# Patient Record
Sex: Male | Born: 1965
Health system: Southern US, Community
[De-identification: ages and names within clinical notes are randomized; demographics above are authoritative.]

## PROBLEM LIST (undated history)

## (undated) DIAGNOSIS — I639 Cerebral infarction, unspecified: Secondary | ICD-10-CM

## (undated) DIAGNOSIS — F32A Depression, unspecified: Secondary | ICD-10-CM

## (undated) DIAGNOSIS — B029 Zoster without complications: Secondary | ICD-10-CM

## (undated) DIAGNOSIS — F329 Major depressive disorder, single episode, unspecified: Secondary | ICD-10-CM

## (undated) DIAGNOSIS — Z9109 Other allergy status, other than to drugs and biological substances: Secondary | ICD-10-CM

## (undated) DIAGNOSIS — I10 Essential (primary) hypertension: Secondary | ICD-10-CM

## (undated) HISTORY — DX: Zoster without complications: B02.9

## (undated) HISTORY — DX: Depression, unspecified: F32.A

## (undated) HISTORY — DX: Other allergy status, other than to drugs and biological substances: Z91.09

## (undated) HISTORY — DX: Major depressive disorder, single episode, unspecified: F32.9

## (undated) HISTORY — DX: Cerebral infarction, unspecified: I63.9

## (undated) HISTORY — DX: Essential (primary) hypertension: I10

## (undated) HISTORY — PX: ESOPHAGUS SURGERY: SHX626

---

## 2001-01-08 ENCOUNTER — Emergency Department (HOSPITAL_COMMUNITY): Admission: EM | Admit: 2001-01-08 | Discharge: 2001-01-08 | Payer: Self-pay | Admitting: *Deleted

## 2004-12-18 ENCOUNTER — Ambulatory Visit: Payer: Self-pay | Admitting: Cardiology

## 2005-10-03 ENCOUNTER — Ambulatory Visit: Payer: Self-pay | Admitting: Cardiology

## 2005-10-03 ENCOUNTER — Inpatient Hospital Stay (HOSPITAL_COMMUNITY): Admission: EM | Admit: 2005-10-03 | Discharge: 2005-10-05 | Payer: Self-pay | Admitting: Emergency Medicine

## 2005-10-04 ENCOUNTER — Ambulatory Visit: Payer: Self-pay | Admitting: *Deleted

## 2006-01-22 ENCOUNTER — Emergency Department (HOSPITAL_COMMUNITY): Admission: EM | Admit: 2006-01-22 | Discharge: 2006-01-22 | Payer: Self-pay | Admitting: Emergency Medicine

## 2011-01-24 ENCOUNTER — Emergency Department (HOSPITAL_COMMUNITY): Payer: Self-pay

## 2011-01-24 ENCOUNTER — Emergency Department (HOSPITAL_COMMUNITY)
Admission: EM | Admit: 2011-01-24 | Discharge: 2011-01-24 | Disposition: A | Discharge status: Home / Self Care | Payer: Self-pay | Attending: Emergency Medicine | Admitting: Emergency Medicine

## 2011-01-24 ENCOUNTER — Encounter: Payer: Self-pay | Admitting: *Deleted

## 2011-01-24 DIAGNOSIS — S0181XA Laceration without foreign body of other part of head, initial encounter: Secondary | ICD-10-CM

## 2011-01-24 DIAGNOSIS — S0180XA Unspecified open wound of other part of head, initial encounter: Secondary | ICD-10-CM | POA: Insufficient documentation

## 2011-01-24 DIAGNOSIS — F17200 Nicotine dependence, unspecified, uncomplicated: Secondary | ICD-10-CM | POA: Insufficient documentation

## 2011-01-24 DIAGNOSIS — S0010XA Contusion of unspecified eyelid and periocular area, initial encounter: Secondary | ICD-10-CM | POA: Insufficient documentation

## 2011-01-24 DIAGNOSIS — IMO0002 Reserved for concepts with insufficient information to code with codable children: Secondary | ICD-10-CM | POA: Insufficient documentation

## 2011-01-24 DIAGNOSIS — Y9229 Other specified public building as the place of occurrence of the external cause: Secondary | ICD-10-CM | POA: Insufficient documentation

## 2011-01-24 DIAGNOSIS — S0083XA Contusion of other part of head, initial encounter: Secondary | ICD-10-CM

## 2011-01-24 MED ORDER — TETANUS-DIPHTH-ACELL PERTUSSIS 5-2.5-18.5 LF-MCG/0.5 IM SUSP
0.5000 mL | Freq: Once | INTRAMUSCULAR | Status: AC
  Administered 2011-01-24: 0.5 mL via INTRAMUSCULAR
  Filled 2011-01-24: qty 0.5

## 2011-01-24 MED ORDER — LIDOCAINE-EPINEPHRINE (PF) 1 %-1:200000 IJ SOLN
INTRAMUSCULAR | Status: AC
  Administered 2011-01-24: 16:00:00
  Filled 2011-01-24: qty 10

## 2011-01-24 MED ORDER — CEPHALEXIN 500 MG PO CAPS: 500.0000 mg | ORAL_CAPSULE | Freq: Four times a day (QID) | ORAL | Status: AC
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
## 2011-01-29 ENCOUNTER — Emergency Department (HOSPITAL_COMMUNITY)
Admission: EM | Admit: 2011-01-29 | Discharge: 2011-01-29 | Disposition: A | Discharge status: Home / Self Care | Payer: Self-pay | Attending: Emergency Medicine | Admitting: Emergency Medicine

## 2011-01-29 ENCOUNTER — Emergency Department (HOSPITAL_COMMUNITY): Payer: Self-pay

## 2011-01-29 ENCOUNTER — Encounter (HOSPITAL_COMMUNITY): Payer: Self-pay | Admitting: *Deleted

## 2011-01-29 ENCOUNTER — Other Ambulatory Visit: Payer: Self-pay

## 2011-01-29 DIAGNOSIS — R000 Tachycardia, unspecified: Secondary | ICD-10-CM | POA: Insufficient documentation

## 2011-01-29 DIAGNOSIS — F0781 Postconcussional syndrome: Secondary | ICD-10-CM | POA: Insufficient documentation

## 2011-01-29 DIAGNOSIS — R42 Dizziness and giddiness: Secondary | ICD-10-CM | POA: Insufficient documentation

## 2011-01-29 DIAGNOSIS — F17200 Nicotine dependence, unspecified, uncomplicated: Secondary | ICD-10-CM | POA: Insufficient documentation

## 2011-01-29 LAB — COMPREHENSIVE METABOLIC PANEL
ALT: 14 U/L (ref 0–53)
AST: 17 U/L (ref 0–37)
BUN: 10 mg/dL (ref 6–23)
Calcium: 9.8 mg/dL (ref 8.4–10.5)
Creatinine, Ser: 0.85 mg/dL (ref 0.50–1.35)
GFR calc Af Amer: >60 mL/min (ref >60)
Glucose, Bld: 80 mg/dL (ref 70–99)
Potassium: 3.5 mEq/L (ref 3.5–5.1)
Sodium: 139 mEq/L (ref 135–145)
Total Bilirubin: 0.4 mg/dL (ref 0.3–1.2)

## 2011-01-29 LAB — CBC
Hemoglobin: 16.4 g/dL (ref 13.0–17.0)
MCHC: 34.2 g/dL (ref 30.0–36.0)
MCV: 91.6 fL (ref 78.0–100.0)
Platelets: 311 10*3/uL (ref 150–400)
RDW: 14.5 % (ref 11.5–15.5)

## 2011-01-29 LAB — DIFFERENTIAL
Lymphocytes Relative: 19 % (ref 12–46)
Lymphs Abs: 2.2 10*3/uL (ref 0.7–4.0)
Neutro Abs: 8.8 10*3/uL — ABNORMAL HIGH (ref 1.7–7.7)
Neutrophils Relative %: 73 % (ref 43–77)

## 2011-01-29 MED ORDER — SODIUM CHLORIDE 0.9 % IV BOLUS (SEPSIS): 1000.0000 mL | Freq: Once | INTRAVENOUS | Status: DC

## 2011-01-29 MED ORDER — MECLIZINE HCL 25 MG PO TABS: 25.0000 mg | ORAL_TABLET | Freq: Four times a day (QID) | ORAL | Status: AC

## 2011-01-29 MED ORDER — MECLIZINE HCL 12.5 MG PO TABS
25.0000 mg | ORAL_TABLET | Freq: Once | ORAL | Status: AC
  Administered 2011-01-29: 25 mg via ORAL
  Filled 2011-01-29: qty 2

## 2011-01-29 NOTE — ED Notes (Signed)
C/o dizziness with nausea 

## 2011-01-29 NOTE — ED Provider Notes (Signed)
History   Scribed for Jeff Lennert, MD, the patient was seen in room APA05/APA05. This chart was scribed by Clarita Crane. This patient's care was started at 6:12PM.  CSN: [id number] Arrival date & time: 01/29/2011  5:52 PM   Chief Complaint  Patient presents with  . Dizziness   HPI Jeff Walker is a 45 y.o. male who presents to the Emergency Department complaining of cosntant dizziness described as his surroundings spinning onset this morning after awaking and persistent since with associated numbness of distal upper extremities, HA, intermittent SOB and fatigue for the past 3 days. Patient reports dizziness is aggravated with movement of his head and mildly relieved with lying still. Denies history of similar symptoms. Patient reports sustaining head injury 6 days ago with laceration above left eye and was evaluated at ED with CT-Head performed. Patient is a current smoker.  HPI ELEMENTS:  Onset: this morning after awaking Duration: persistent   Timing: constant  Quality: surroundings spinning   Modifying factors:  Relieved with lying still and aggravated with movement of head. Context:  as above  Associated symptoms: +nausea, numbness of distal upper extremities, HA, fatigue for 3 days, SOB intermittently. Denies chest pain, abdominal pain, blurred vision.    PAST MEDICAL HISTORY:  History reviewed. No pertinent past medical history.  PAST SURGICAL HISTORY:  Past Surgical History  Procedure Date  . Esophagus surgery     FAMILY HISTORY:  Family History  Problem Relation Age of Onset  . Hypertension Father   . Cancer Father      SOCIAL HISTORY: History   Social History  . Marital Status: Single    Spouse Name: N/A    Number of Children: N/A  . Years of Education: N/A   Social History Main Topics  . Smoking status: Current Everyday Smoker -- 2.0 packs/day    Types: Cigarettes  . Smokeless tobacco: None  . Alcohol Use: Yes     6 pack per week beer  . Drug Use:    . Sexually Active:    Other Topics Concern  . None   Social History Narrative  . None      Review of Systems  Constitutional: Positive for fatigue.  HENT: Negative for congestion, sinus pressure and ear discharge.   Eyes: Negative for discharge.  Respiratory: Positive for shortness of breath. Negative for cough.   Cardiovascular: Negative for chest pain.  Gastrointestinal: Negative for abdominal pain and diarrhea.  Genitourinary: Negative for frequency and hematuria.  Musculoskeletal: Negative for back pain.  Skin: Negative for rash.  Neurological: Positive for dizziness, numbness and headaches. Negative for seizures.  Hematological: Negative.   Psychiatric/Behavioral: Negative for hallucinations.    Allergies  Review of patient's allergies indicates no known allergies.  Home Medications   Current Outpatient Rx  Name Route Sig Dispense Refill  . IBUPROFEN 200 MG PO TABS Oral Take 400 mg by mouth once as needed. For pain     . CEPHALEXIN 500 MG PO CAPS Oral Take 1 capsule (500 mg total) by mouth 4 (four) times daily. 28 capsule 0    Physical Exam    BP 132/71  Pulse 71  Temp(Src) 98.4 F (36.9 C) (Oral)  Resp 20  Ht 6' (1.829 m)  Wt 155 lb (70.308 kg)  BMI 21.02 kg/m2  SpO2 99%  Physical Exam  Nursing note and vitals reviewed. Constitutional: He is oriented to person, place, and time. He appears well-developed and well-nourished. No distress.  HENT:  Head:  Normocephalic and atraumatic.  Right Ear: Tympanic membrane normal.  Left Ear: Tympanic membrane normal.  Mouth/Throat: Oropharynx is clear and moist. Mucous membranes are dry.       Healing laceration to left side of forehead with multiple sutures currently in place.   Eyes: Conjunctivae are normal. Pupils are equal, round, and reactive to light.       Periorbital bruising of left eye.   Neck: Neck supple.  Cardiovascular: Regular rhythm and normal heart sounds.  Tachycardia present.  Exam reveals no  gallop and no friction rub.   No murmur heard. Pulmonary/Chest: Effort normal and breath sounds normal. He has no wheezes.  Abdominal: Soft. Bowel sounds are normal. He exhibits no distension. There is no tenderness.  Musculoskeletal: Normal range of motion. He exhibits no edema.  Neurological: He is alert and oriented to person, place, and time. He has normal reflexes. No sensory deficit. Coordination normal.       Grip strength normal and equal bilaterally.   Skin: Skin is warm and dry.  Psychiatric: He has a normal mood and affect. His behavior is normal.    ED Course  Procedures  OTHER DATA REVIEWED: Nursing notes, vital signs, and past medical records reviewed. Lab results reviewed and considered Imaging results reviewed and considered  DIAGNOSTIC STUDIES: Oxygen Saturation is 100% on room air, normal by my interpretation.    LABS / RADIOLOGY: Results for orders placed during the hospital encounter of 01/29/11  CBC      Component Value Range   WBC 12.0 (*) 4.0 - 10.5 (K/uL)   RBC 5.23  4.22 - 5.81 (MIL/uL)   Hemoglobin 16.4  13.0 - 17.0 (g/dL)   HCT 04.5  40.9 - 81.1 (%)   MCV 91.6  78.0 - 100.0 (fL)   MCH 31.4  26.0 - 34.0 (pg)   MCHC 34.2  30.0 - 36.0 (g/dL)   RDW 91.4  78.2 - 95.6 (%)   Platelets 311  150 - 400 (K/uL)  DIFFERENTIAL      Component Value Range   Neutrophils Relative 73  43 - 77 (%)   Neutro Abs 8.8 (*) 1.7 - 7.7 (K/uL)   Lymphocytes Relative 19  12 - 46 (%)   Lymphs Abs 2.2  0.7 - 4.0 (K/uL)   Monocytes Relative 6  3 - 12 (%)   Monocytes Absolute 0.7  0.1 - 1.0 (K/uL)   Eosinophils Relative 2  0 - 5 (%)   Eosinophils Absolute 0.2  0.0 - 0.7 (K/uL)   Basophils Relative 1  0 - 1 (%)   Basophils Absolute 0.1  0.0 - 0.1 (K/uL)  COMPREHENSIVE METABOLIC PANEL      Component Value Range   Sodium 139  135 - 145 (mEq/L)   Potassium 3.5  3.5 - 5.1 (mEq/L)   Chloride 100  96 - 112 (mEq/L)   CO2 25  19 - 32 (mEq/L)   Glucose, Bld 80  70 - 99 (mg/dL)    BUN 10  6 - 23 (mg/dL)   Creatinine, Ser 2.13  0.50 - 1.35 (mg/dL)   Calcium 9.8  8.4 - 08.6 (mg/dL)   Total Protein 8.2  6.0 - 8.3 (g/dL)   Albumin 3.8  3.5 - 5.2 (g/dL)   AST 17  0 - 37 (U/L)   ALT 14  0 - 53 (U/L)   Alkaline Phosphatase 82  39 - 117 (U/L)   Total Bilirubin 0.4  0.3 - 1.2 (mg/dL)   GFR calc  non Af Amer >60  >60 (mL/min)   GFR calc Af Amer >60  >60 (mL/min)  POCT I-STAT TROPONIN I      Component Value Range   Troponin i, poc 0.00  0.00 - 0.08 (ng/mL)   Comment 3            Dg Chest 2 View  01/29/2011  *RADIOLOGY REPORT*  Clinical Data: Near-syncope  CHEST - 2 VIEW  Comparison: 10/03/2005  Findings: Hyperinflation consistent with COPD.  Postsurgical change right superior hemithorax.  Mild plate-like atelectasis left base. No infiltrates or failure. Normal heart size.  No effusion or pneumothorax.  Bones unremarkable except for postsurgical change. Stable appearance from priors except that the left base atelectasis is new.  IMPRESSION: Hyperinflation.  Postsurgical changes right superior hemithorax. New plate-like atelectasis left base.  Original Report Authenticated By: Elsie Stain, M.D.   Ct Head Wo Contrast  01/29/2011  *RADIOLOGY REPORT*  Clinical Data: 45 year old male with head injury and continued headache.  CT HEAD WITHOUT CONTRAST  Technique:  Contiguous axial images were obtained from the base of the skull through the vertex without contrast.  Comparison: 01/24/2011  Findings: No acute intracranial abnormalities are identified, including mass lesion or mass effect, hydrocephalus, extra-axial fluid collection, midline shift, hemorrhage, or acute infarction.  The visualized bony calvarium is unremarkable. Forehead soft tissue swelling is identified.  IMPRESSION: No evidence of acute intracranial abnormality.  Forehead soft tissue swelling.  Original Report Authenticated By: Rosendo Gros, M.D.    PROCEDURES:  ED COURSE / COORDINATION OF CARE: Orders Placed This  Encounter  Procedures  . DG Chest 2 View  . CT Head Wo Contrast  . CT Head Wo Contrast  . CBC  . Differential  . Comprehensive metabolic panel  . POCT i-Stat troponin I  . ED EKG  10:09PM- Patient informed of current lab and imaging results. Patient explained that CT performed several days ago in relation to head injury was CT-maxillofacial and not Ct- Head and recommend obtaining CT-Head for evaluation of current complaint. Patient consents to have Ct- Head performed.  10:56PM- Patient informed of normal CT- Head results. Patient reports dizziness has improved although still mildly persistent. Informed of intent to d/c home after stitches removed from healing laceration above left brow. Patient agrees with plan set forth.   AVW:[id number],  Post concusion   PLAN: Discharge Home The patient is to return the emergency department if there is any worsening of symptoms. I have reviewed the discharge instructions with the patient/family  CONDITION ON DISCHARGE: Good.  MEDICATIONS GIVEN IN THE E.D.  Medications  sodium chloride 0.9 % bolus 1,000 mL (not administered)  meclizine (ANTIVERT) tablet 25 mg (25 mg Oral Given 01/29/11 1830)      The chart was scribed for me under my direct supervision.  I personally performed the history, physical, and medical decision making and all procedures in the evaluation of this patient.Jeff Lennert, MD 01/29/11 [phone]

## 2011-01-29 NOTE — ED Notes (Signed)
Patient refuse ct. States he had one last week. Dr.zammitt notified

## 2011-01-29 NOTE — ED Notes (Signed)
Dizziness and nausea that started today,

## 2011-01-29 NOTE — ED Notes (Signed)
Patient given sandwich and drink.  

## 2012-04-25 ENCOUNTER — Emergency Department (HOSPITAL_COMMUNITY): Payer: Self-pay

## 2012-04-25 ENCOUNTER — Emergency Department (HOSPITAL_COMMUNITY)
Admission: EM | Admit: 2012-04-25 | Discharge: 2012-04-25 | Disposition: A | Discharge status: Home / Self Care | Payer: Self-pay | Attending: Emergency Medicine | Admitting: Emergency Medicine

## 2012-04-25 ENCOUNTER — Encounter (HOSPITAL_COMMUNITY): Payer: Self-pay | Admitting: *Deleted

## 2012-04-25 DIAGNOSIS — J3489 Other specified disorders of nose and nasal sinuses: Secondary | ICD-10-CM | POA: Insufficient documentation

## 2012-04-25 DIAGNOSIS — R509 Fever, unspecified: Secondary | ICD-10-CM | POA: Insufficient documentation

## 2012-04-25 DIAGNOSIS — J111 Influenza due to unidentified influenza virus with other respiratory manifestations: Secondary | ICD-10-CM

## 2012-04-25 DIAGNOSIS — R630 Anorexia: Secondary | ICD-10-CM | POA: Insufficient documentation

## 2012-04-25 DIAGNOSIS — M2550 Pain in unspecified joint: Secondary | ICD-10-CM | POA: Insufficient documentation

## 2012-04-25 DIAGNOSIS — R1110 Vomiting, unspecified: Secondary | ICD-10-CM | POA: Insufficient documentation

## 2012-04-25 DIAGNOSIS — F17200 Nicotine dependence, unspecified, uncomplicated: Secondary | ICD-10-CM | POA: Insufficient documentation

## 2012-04-25 DIAGNOSIS — R21 Rash and other nonspecific skin eruption: Secondary | ICD-10-CM | POA: Insufficient documentation

## 2012-04-25 DIAGNOSIS — R5381 Other malaise: Secondary | ICD-10-CM | POA: Insufficient documentation

## 2012-04-25 LAB — CBC WITH DIFFERENTIAL/PLATELET
Basophils Absolute: 0.1 10*3/uL (ref 0.0–0.1)
Eosinophils Absolute: 0.2 10*3/uL (ref 0.0–0.7)
Eosinophils Relative: 3 % (ref 0–5)
Hemoglobin: 15.9 g/dL (ref 13.0–17.0)
Lymphocytes Relative: 43 % (ref 12–46)
Lymphs Abs: 2.8 10*3/uL (ref 0.7–4.0)
MCV: 91.8 fL (ref 78.0–100.0)
Monocytes Relative: 8 % (ref 3–12)
Platelets: 212 10*3/uL (ref 150–400)

## 2012-04-25 LAB — BASIC METABOLIC PANEL
BUN: 8 mg/dL (ref 6–23)
Calcium: 9 mg/dL (ref 8.4–10.5)
GFR calc non Af Amer: >90 mL/min (ref >90)
Glucose, Bld: 71 mg/dL (ref 70–99)

## 2012-04-25 MED ORDER — SODIUM CHLORIDE 0.9 % IV BOLUS (SEPSIS)
1000.0000 mL | Freq: Once | INTRAVENOUS | Status: AC
  Administered 2012-04-25: 1000 mL via INTRAVENOUS

## 2012-04-25 MED ORDER — PREDNISONE 50 MG PO TABS: 60.0000 mg | ORAL_TABLET | Freq: Once | ORAL | Status: DC

## 2012-04-25 MED ORDER — ALBUTEROL SULFATE HFA 108 (90 BASE) MCG/ACT IN AERS
2.0000 | INHALATION_SPRAY | RESPIRATORY_TRACT | Status: DC | PRN
  Administered 2012-04-25: 2 via RESPIRATORY_TRACT
  Filled 2012-04-25: qty 6.7

## 2012-04-25 MED ORDER — PREDNISONE 10 MG PO TABS: 20.0000 mg | ORAL_TABLET | Freq: Every day | ORAL | Status: DC

## 2012-04-25 MED ORDER — ALBUTEROL SULFATE (5 MG/ML) 0.5% IN NEBU
5.0000 mg | INHALATION_SOLUTION | Freq: Once | RESPIRATORY_TRACT | Status: AC
  Administered 2012-04-25: 5 mg via RESPIRATORY_TRACT
  Filled 2012-04-25: qty 1

## 2012-04-25 MED ORDER — PREDNISONE 20 MG PO TABS
ORAL_TABLET | ORAL | Status: AC
  Administered 2012-04-25: 60 mg via ORAL
  Filled 2012-04-25: qty 3

## 2012-04-25 MED ORDER — IPRATROPIUM BROMIDE 0.02 % IN SOLN
0.5000 mg | Freq: Once | RESPIRATORY_TRACT | Status: AC
  Administered 2012-04-25: 0.5 mg via RESPIRATORY_TRACT
  Filled 2012-04-25: qty 2.5

## 2012-04-25 NOTE — ED Provider Notes (Signed)
History     CSN: [id number]  Arrival date & time 04/25/12  1904   First MD Initiated Contact with Patient 04/25/12 1923      Chief Complaint  Patient presents with  . Fever    (Consider location/radiation/quality/duration/timing/severity/associated sxs/prior treatment) HPI  Patient complaining of fever x 2 weeks.  Symptoms began with nasal congestion, then cough, nausea, vomiting.  Aching all over.  Patient has not been seen prior to today.  Fever 100.5 last Sunday but has not checked since.  Cough productive of yellow sputum for one week.  Some sob, continues to smoke.  Taking po well, vomited x 5 today.  Some vomiting intermittently but has not been every day. NO diarrhea.  Positive sick contacts.  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  Alcohol intake several times per week.   History reviewed. No pertinent past medical history.  Past Surgical History  Procedure Date  . Esophagus surgery     Family History  Problem Relation Age of Onset  . Hypertension Father   . Cancer Father     History  Substance Use Topics  . Smoking status: Current Every Day Smoker -- 2.0 packs/day    Types: Cigarettes  . Smokeless tobacco: Not on file  . Alcohol Use: Yes     Comment: 6 pack per week beer      Review of Systems  Constitutional: Positive for fever, chills, appetite change and fatigue.  HENT: Positive for congestion. Negative for neck stiffness.   Eyes: Negative for visual disturbance.  Respiratory: Negative for shortness of breath.   Cardiovascular: Negative for chest pain.  Gastrointestinal: Positive for vomiting. Negative for diarrhea and blood in stool.  Genitourinary: Negative for dysuria, frequency and decreased urine volume.  Musculoskeletal: Positive for arthralgias. Negative for myalgias and joint swelling.  Skin: Positive for rash.  Neurological: Negative for weakness.  Hematological: Negative for adenopathy.  Psychiatric/Behavioral: Negative  for agitation.    Allergies  Review of patient's allergies indicates no known allergies.  Home Medications   Current Outpatient Rx  Name  Route  Sig  Dispense  Refill  . IBUPROFEN 200 MG PO TABS   Oral   Take 400 mg by mouth once as needed. For pain            BP 149/101  Pulse 90  Temp 98.5 F (36.9 C) (Oral)  Resp 18  Ht 6' (1.829 m)  Wt 150 lb (68.04 kg)  BMI 20.34 kg/m2  SpO2 100%  Physical Exam  Nursing note and vitals reviewed. Constitutional: He is oriented to person, place, and time. He appears well-developed and well-nourished.  HENT:  Head: Normocephalic and atraumatic.  Right Ear: External ear normal.  Left Ear: External ear normal.  Nose: Nose normal.  Mouth/Throat: Oropharynx is clear and moist.  Eyes: Conjunctivae normal and EOM are normal. Pupils are equal, round, and reactive to light.  Neck: Normal range of motion. Neck supple.  Cardiovascular: Normal rate, regular rhythm, normal heart sounds and intact distal pulses.   Pulmonary/Chest: Effort normal. No respiratory distress. He has no wheezes. He has rales. He exhibits no tenderness.  Abdominal: Soft. Bowel sounds are normal. He exhibits no distension and no mass. There is no tenderness. There is no guarding.  Musculoskeletal: Normal range of motion.  Neurological: He is alert and oriented to person, place, and time. He has normal reflexes. He exhibits normal muscle tone. Coordination normal.  Skin: Skin is warm and dry.  Psychiatric: He has a normal mood and affect. His behavior is normal. Judgment and thought content normal.    ED Course  Procedures (including critical care time)  Labs Reviewed - No data to display No results found.   No diagnosis found.   Results for orders placed during the hospital encounter of 04/25/12  CBC WITH DIFFERENTIAL      Component Value Range   WBC 6.4  4.0 - 10.5 K/uL   RBC 4.78  4.22 - 5.81 MIL/uL   Hemoglobin 15.9  13.0 - 17.0 g/dL   HCT 40.9  81.1 -  91.4 %   MCV 91.8  78.0 - 100.0 fL   MCH 33.3  26.0 - 34.0 pg   MCHC 36.2 (*) 30.0 - 36.0 g/dL   RDW 78.2  95.6 - 21.3 %   Platelets 212  150 - 400 K/uL   Neutrophils Relative 45  43 - 77 %   Neutro Abs 2.9  1.7 - 7.7 K/uL   Lymphocytes Relative 43  12 - 46 %   Lymphs Abs 2.8  0.7 - 4.0 K/uL   Monocytes Relative 8  3 - 12 %   Monocytes Absolute 0.5  0.1 - 1.0 K/uL   Eosinophils Relative 3  0 - 5 %   Eosinophils Absolute 0.2  0.0 - 0.7 K/uL   Basophils Relative 1  0 - 1 %   Basophils Absolute 0.1  0.0 - 0.1 K/uL  BASIC METABOLIC PANEL      Component Value Range   Sodium 139  135 - 145 mEq/L   Potassium 3.3 (*) 3.5 - 5.1 mEq/L   Chloride 101  96 - 112 mEq/L   CO2 29  19 - 32 mEq/L   Glucose, Bld 71  70 - 99 mg/dL   BUN 8  6 - 23 mg/dL   Creatinine, Ser 0.86  0.50 - 1.35 mg/dL   Calcium 9.0  8.4 - 57.8 mg/dL   GFR calc non Af Amer >90  >90 mL/min   GFR calc Af Amer >90  >90 mL/min    MDM  Patient with influenza-like illness. No focal infiltrate noted on chest x-Jaionna Weisse. She is a smoker and has had cough with symptoms continuing for 2 weeks. He'll be placed on prednisone and given an albuterol inhaler. The symptoms of the influenza-like illness have been present for 2 weeks and Tamiflu is not indicated       Hilario Quarry, MD 04/25/12 2118

## 2012-04-25 NOTE — ED Notes (Signed)
IV Fluids infusing Patient sitting upright on stretcher watching TV. No needs verbalized

## 2012-04-25 NOTE — ED Notes (Signed)
Fever, cough , body aches, congestion, vomiting, no diarrhea.

## 2012-04-25 NOTE — ED Notes (Signed)
Mask on when patient placed into room.  Placed on droplet precautions and patient educated along with family

## 2012-07-04 IMAGING — CT CT HEAD W/O CM
1 series · 16 of 30 positions shown, 20 images · non-contrast
Comparison: 01/24/2011

CLINICAL DATA: 45-year-old male with head injury and continued
headache.

CT HEAD WITHOUT CONTRAST
TECHNIQUE: Contiguous axial images were obtained from the base of
the skull through the vertex without contrast.

[Series 2: headtrauma 4.8 h37s · axial · 0.43mm/px · z∈[+108,+268]mm · 16 of 36 slices shown, 20 images]
[im 2/36  brain]
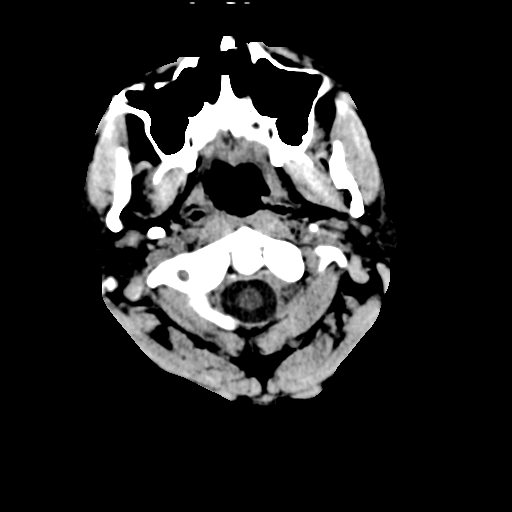
[im 2/36  bone]
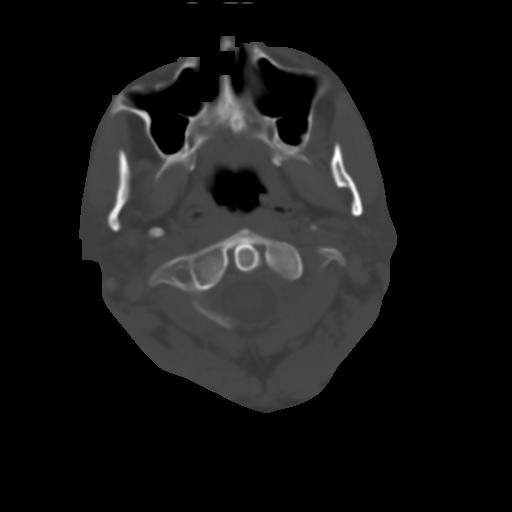
[im 4/36  brain]
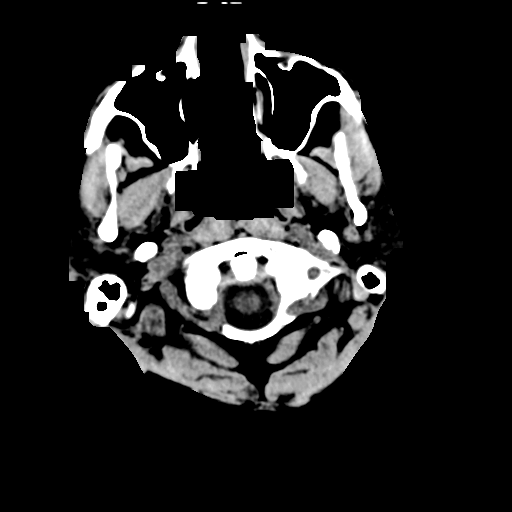
[im 7/36  brain]
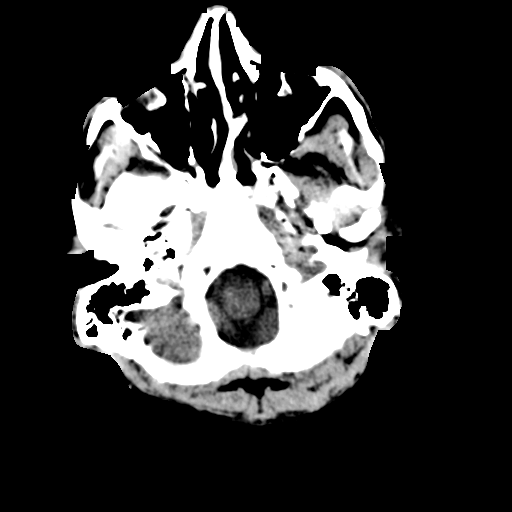
[im 9/36  brain]
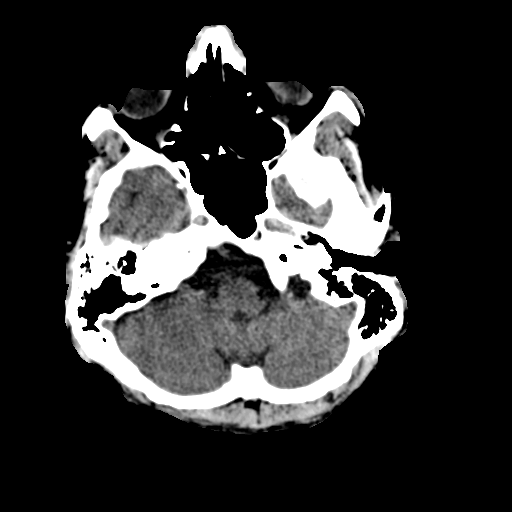
[im 10/36  brain]
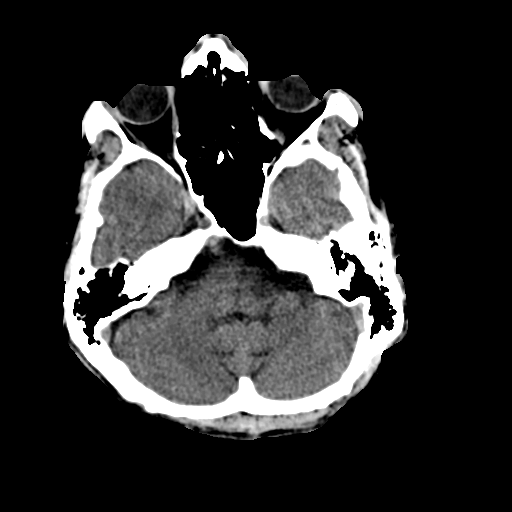
[im 10/36  bone]
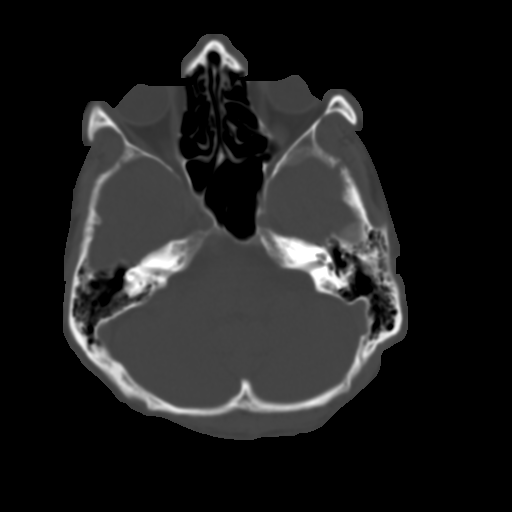
[im 13/36  brain]
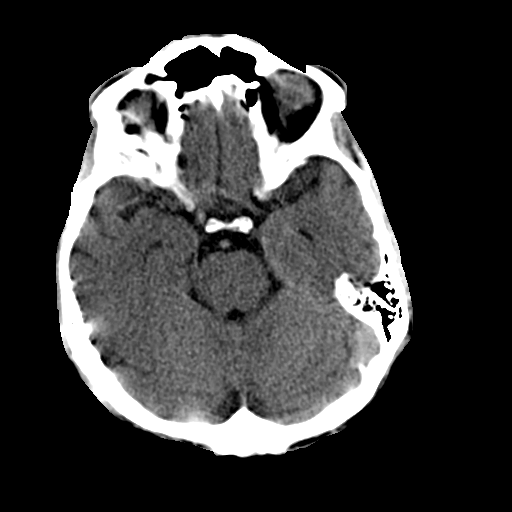
[im 15/36  brain]
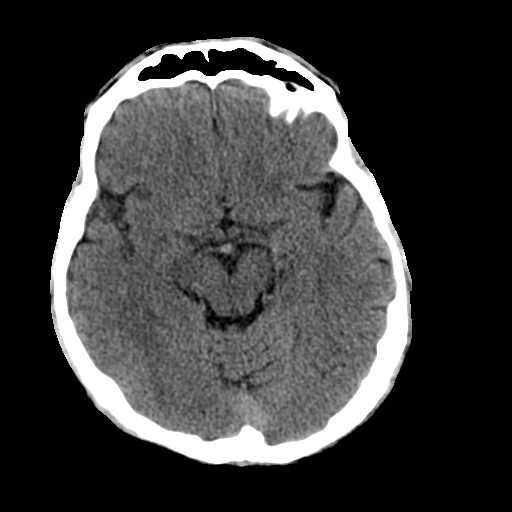
[im 17/36  brain]
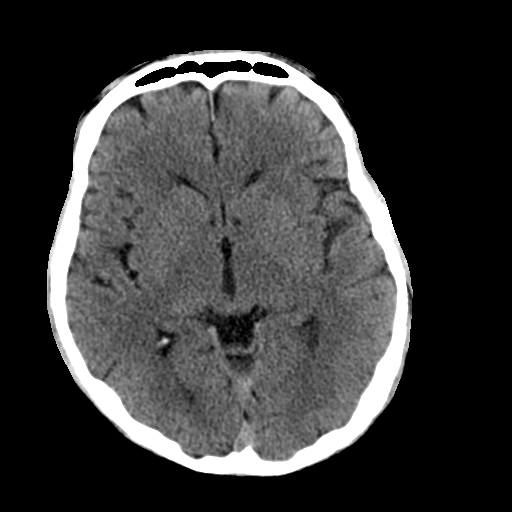
[im 19/36  brain]
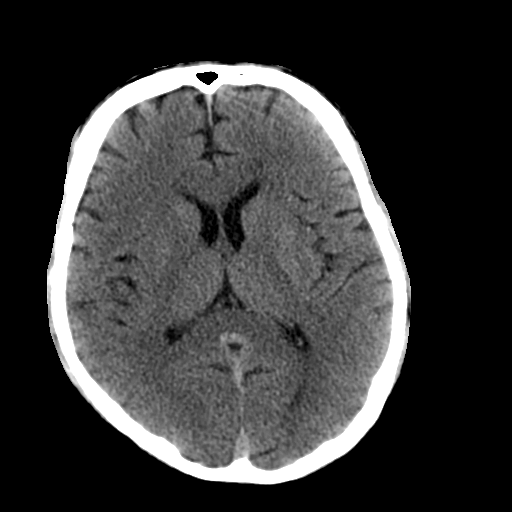
[im 19/36  bone]
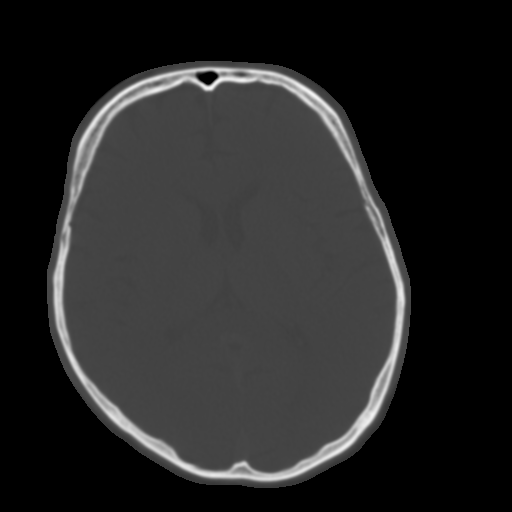
[im 21/36  brain]
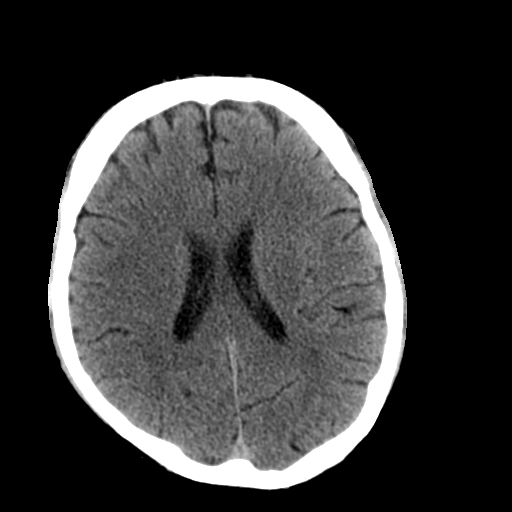
[im 23/36  brain]
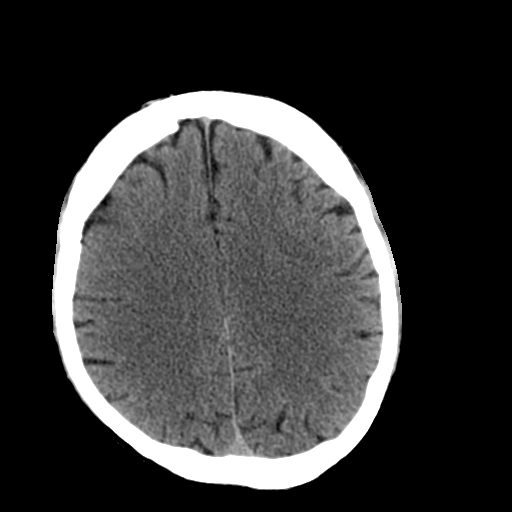
[im 26/36  brain]
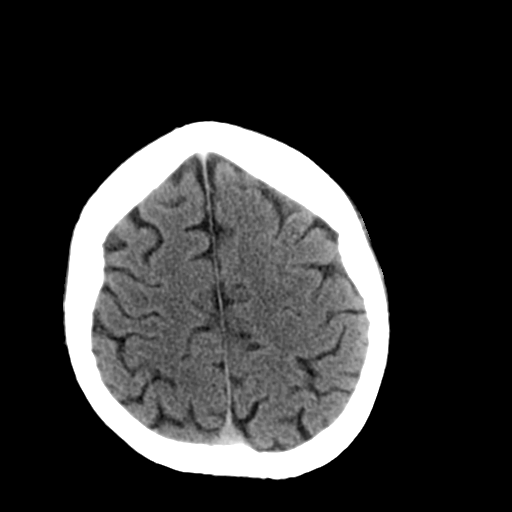
[im 27/36  brain]
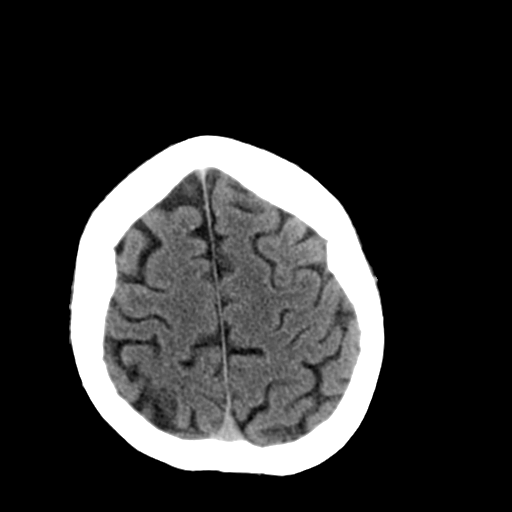
[im 27/36  bone]
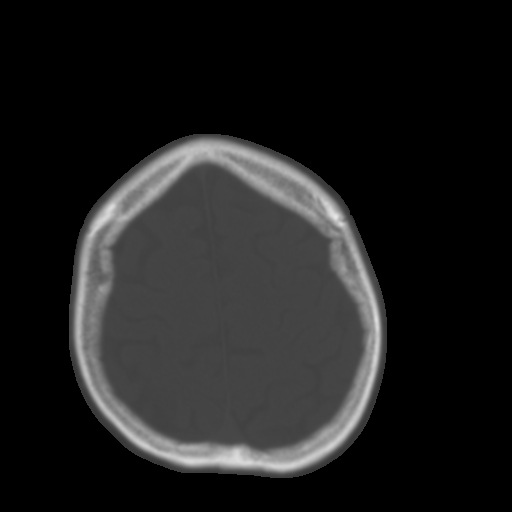
[im 29/36  brain]
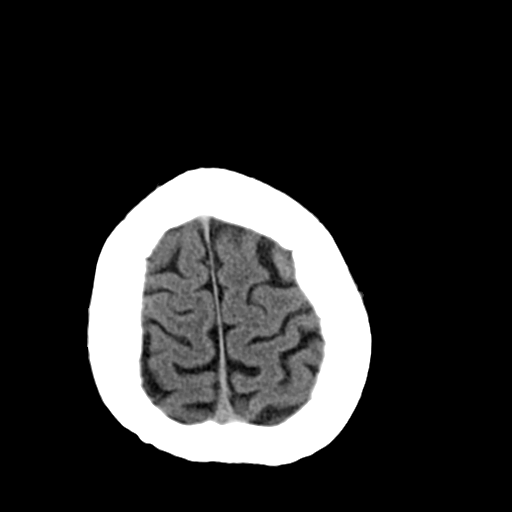
[im 32/36  brain]
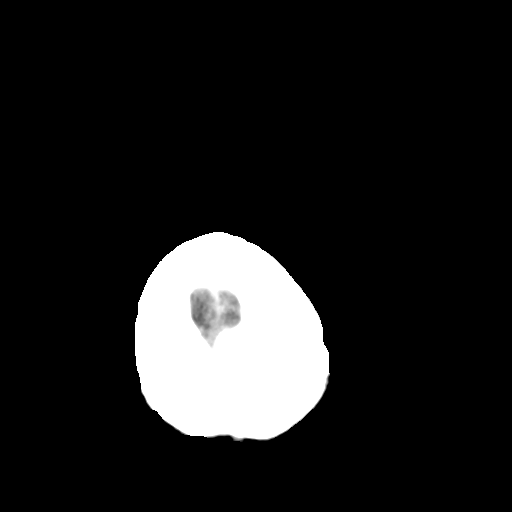
[im 34/36  brain]
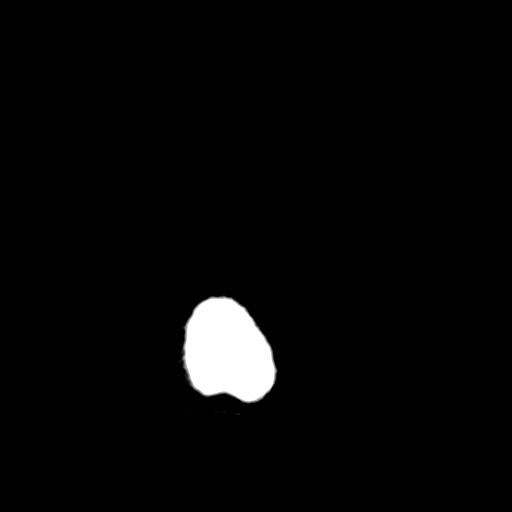

[16 of 30 positions shown; findings below may reference images not displayed]

FINDINGS: No acute intracranial abnormalities are identified,
including mass lesion or mass effect, hydrocephalus, extra-axial
fluid collection, midline shift, hemorrhage, or acute infarction.

The visualized bony calvarium is unremarkable.
Forehead soft tissue swelling is identified.
IMPRESSION: No evidence of acute intracranial abnormality.

Forehead soft tissue swelling.

## 2012-07-04 IMAGING — CR DG CHEST 2V
2 series · 2 of 2 positions shown · non-contrast
Comparison: 10/03/2005

CLINICAL DATA: Near-syncope

CHEST - 2 VIEW

[view not recorded (1 of 2)]
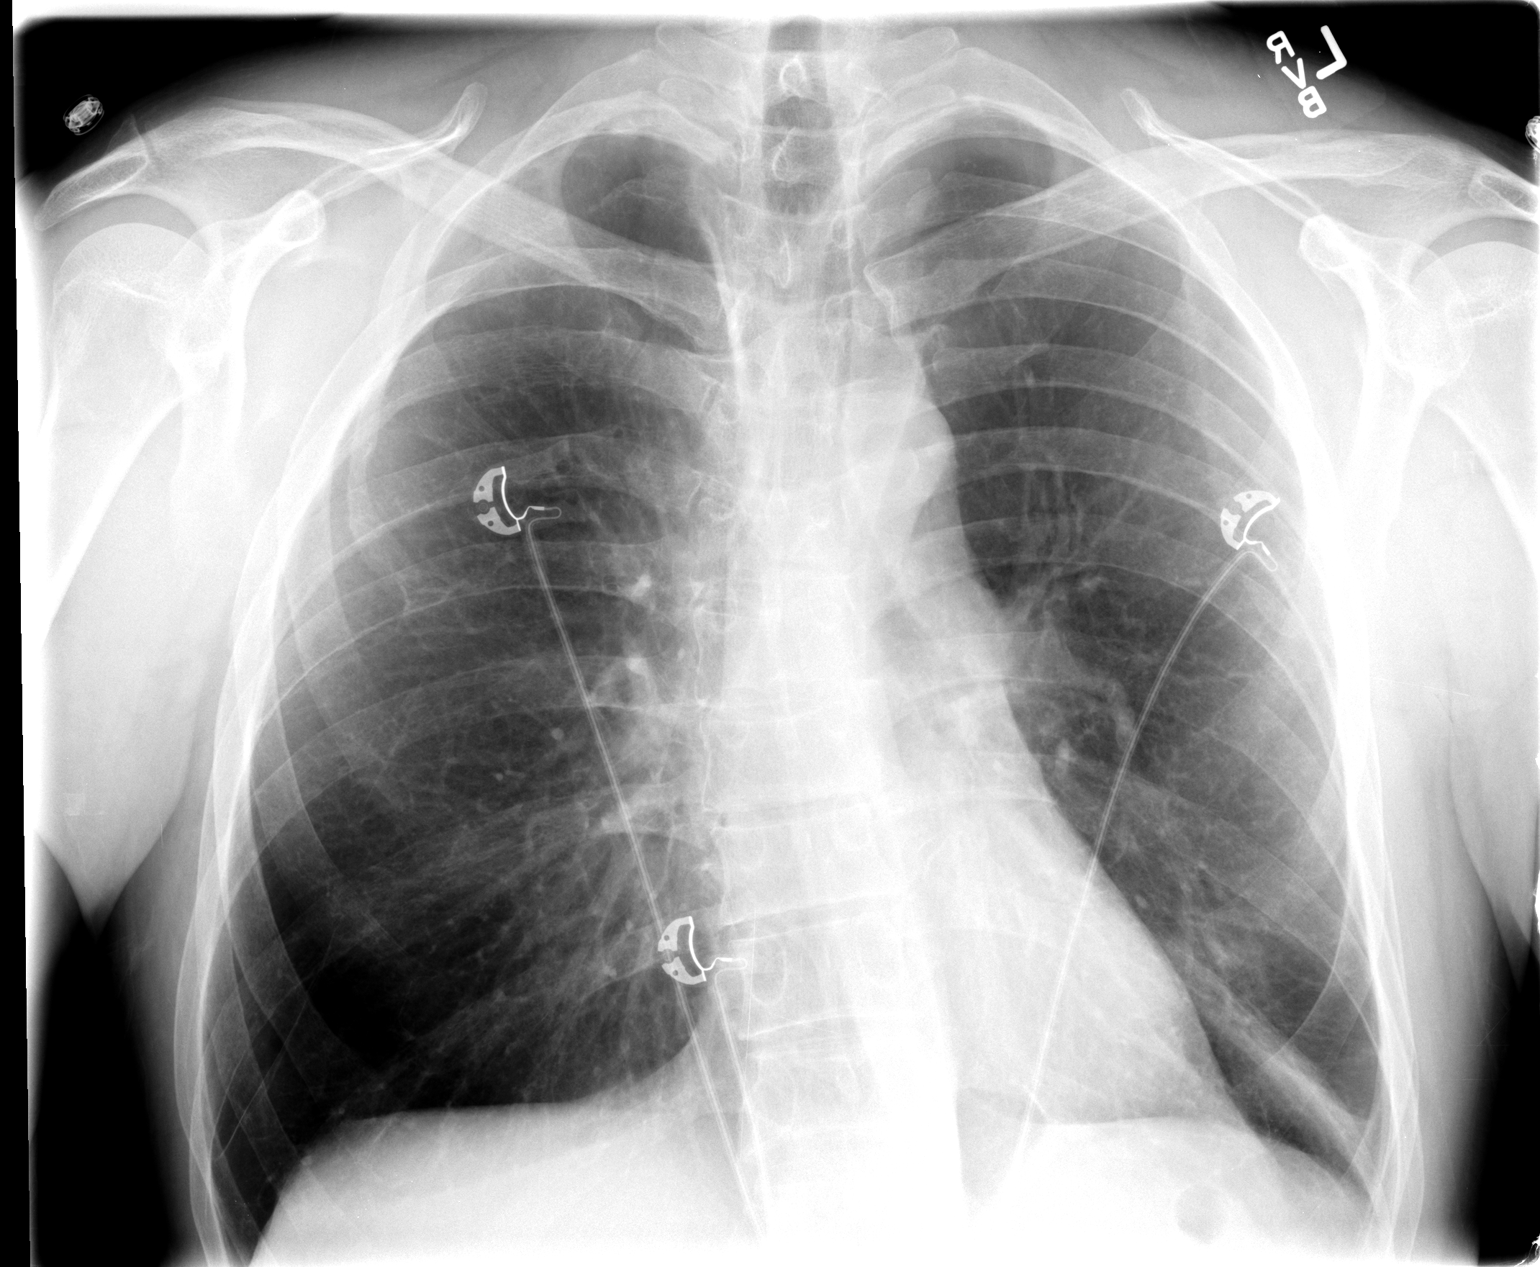

[view not recorded (2 of 2)]
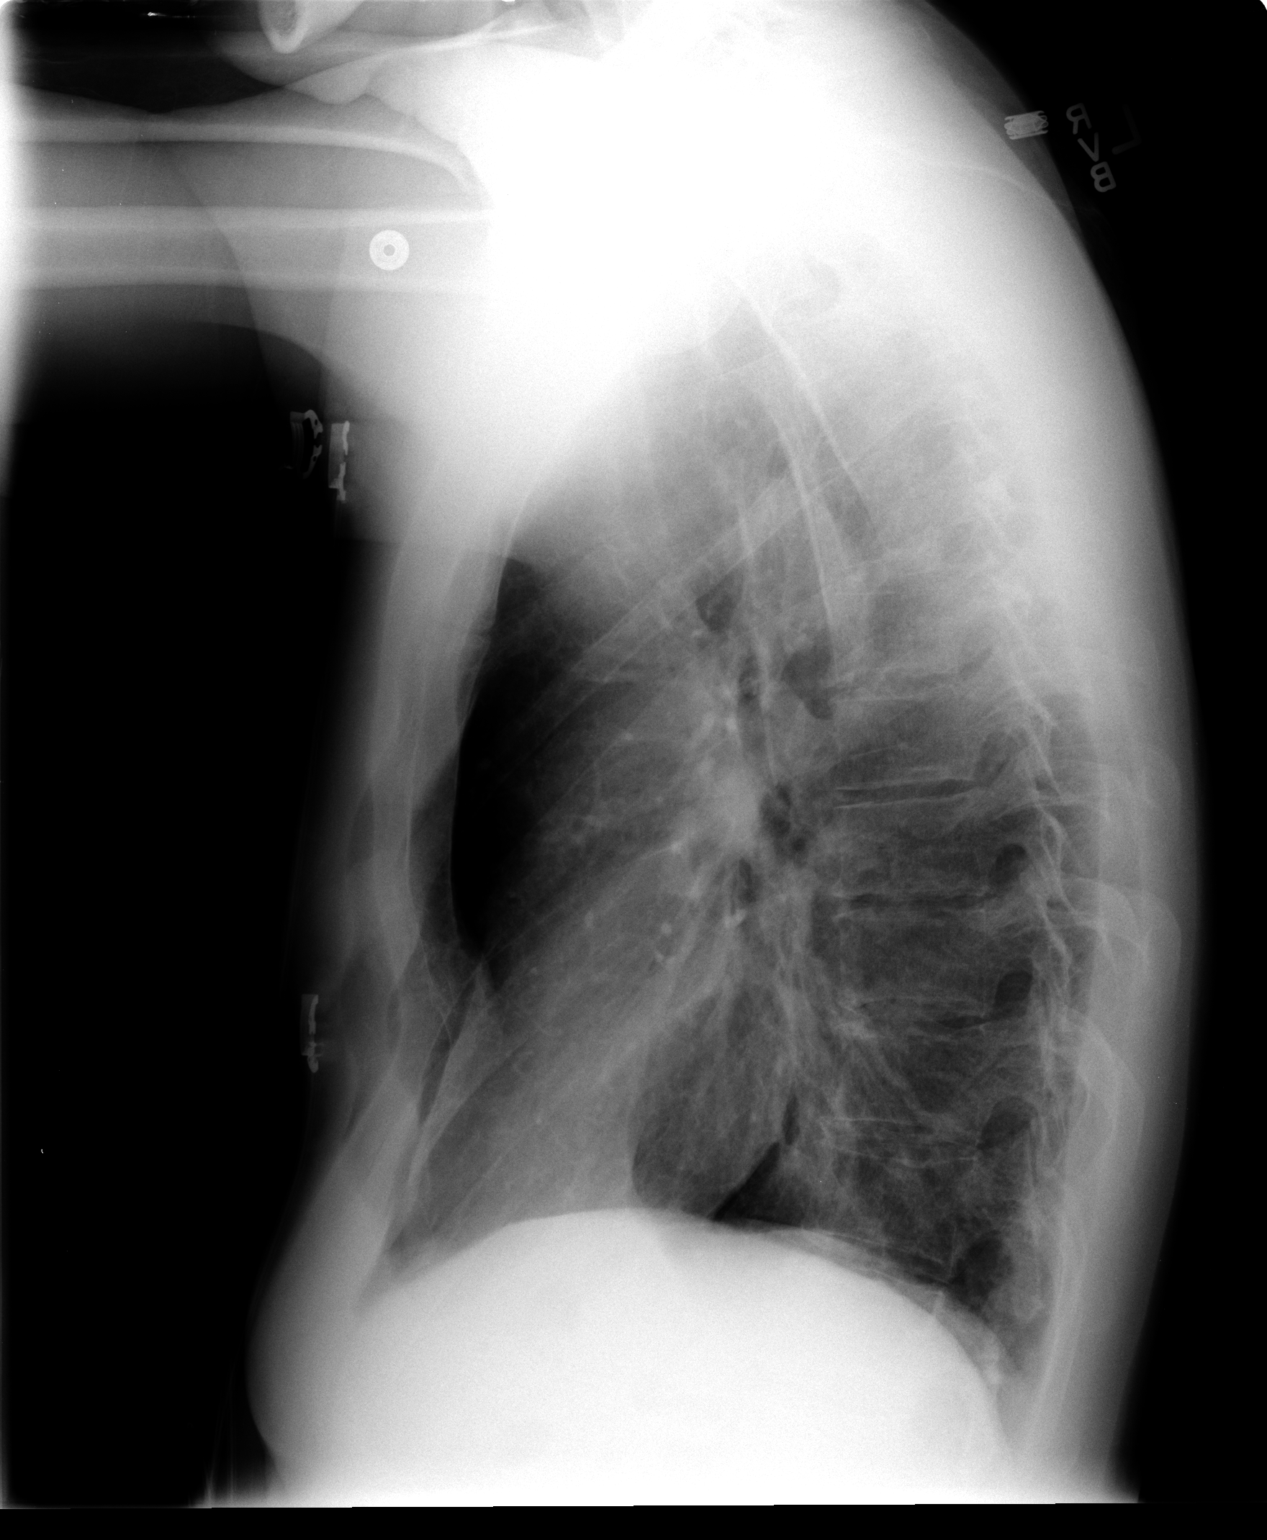

[2 of 2 positions shown; findings below may reference images not displayed]

FINDINGS: Hyperinflation consistent with COPD.  Postsurgical change
right superior hemithorax.  Mild plate-like atelectasis left base.
No infiltrates or failure. Normal heart size.  No effusion or
pneumothorax.  Bones unremarkable except for postsurgical change.
Stable appearance from priors except that the left base atelectasis
is new.
IMPRESSION: Hyperinflation.  Postsurgical changes right superior hemithorax.
New plate-like atelectasis left base.

## 2012-07-08 ENCOUNTER — Emergency Department (HOSPITAL_COMMUNITY): Payer: Self-pay

## 2012-07-08 ENCOUNTER — Encounter (HOSPITAL_COMMUNITY): Payer: Self-pay

## 2012-07-08 ENCOUNTER — Emergency Department (HOSPITAL_COMMUNITY)
Admission: EM | Admit: 2012-07-08 | Discharge: 2012-07-08 | Disposition: A | Discharge status: Home / Self Care | Payer: Self-pay | Attending: Emergency Medicine | Admitting: Emergency Medicine

## 2012-07-08 DIAGNOSIS — K047 Periapical abscess without sinus: Secondary | ICD-10-CM | POA: Insufficient documentation

## 2012-07-08 DIAGNOSIS — J069 Acute upper respiratory infection, unspecified: Secondary | ICD-10-CM | POA: Insufficient documentation

## 2012-07-08 DIAGNOSIS — R51 Headache: Secondary | ICD-10-CM | POA: Insufficient documentation

## 2012-07-08 DIAGNOSIS — J111 Influenza due to unidentified influenza virus with other respiratory manifestations: Secondary | ICD-10-CM

## 2012-07-08 DIAGNOSIS — F17200 Nicotine dependence, unspecified, uncomplicated: Secondary | ICD-10-CM | POA: Insufficient documentation

## 2012-07-08 DIAGNOSIS — R630 Anorexia: Secondary | ICD-10-CM | POA: Insufficient documentation

## 2012-07-08 DIAGNOSIS — R509 Fever, unspecified: Secondary | ICD-10-CM | POA: Insufficient documentation

## 2012-07-08 LAB — COMPREHENSIVE METABOLIC PANEL
ALT: 8 U/L (ref 0–53)
Albumin: 3.5 g/dL (ref 3.5–5.2)
Alkaline Phosphatase: 80 U/L (ref 39–117)
BUN: 6 mg/dL (ref 6–23)
Potassium: 3.5 mEq/L (ref 3.5–5.1)
Sodium: 136 mEq/L (ref 135–145)
Total Protein: 7.2 g/dL (ref 6.0–8.3)

## 2012-07-08 LAB — CBC WITH DIFFERENTIAL/PLATELET
Basophils Absolute: 0 10*3/uL (ref 0.0–0.1)
Basophils Relative: 0 % (ref 0–1)
Eosinophils Absolute: 0.1 10*3/uL (ref 0.0–0.7)
MCH: 32.7 pg (ref 26.0–34.0)
MCHC: 34.8 g/dL (ref 30.0–36.0)
Neutrophils Relative %: 76 % (ref 43–77)
Platelets: 263 10*3/uL (ref 150–400)
RDW: 13.1 % (ref 11.5–15.5)

## 2012-07-08 LAB — TROPONIN I: Troponin I: <0.3 ng/mL (ref <0.30)

## 2012-07-08 MED ORDER — SODIUM CHLORIDE 0.9 % IV BOLUS (SEPSIS)
1000.0000 mL | Freq: Once | INTRAVENOUS | Status: AC
  Administered 2012-07-08: 1000 mL via INTRAVENOUS

## 2012-07-08 MED ORDER — ALBUTEROL SULFATE HFA 108 (90 BASE) MCG/ACT IN AERS: 2.0000 | INHALATION_SPRAY | RESPIRATORY_TRACT | Status: DC | PRN

## 2012-07-08 MED ORDER — CLINDAMYCIN HCL 150 MG PO CAPS: 300.0000 mg | ORAL_CAPSULE | Freq: Three times a day (TID) | ORAL | Status: DC

## 2012-07-08 NOTE — ED Notes (Signed)
Patient transported to X-ray 

## 2012-07-08 NOTE — ED Notes (Signed)
MD at bedside. 

## 2012-07-08 NOTE — ED Notes (Signed)
Pt jaw pain that started this morning, states has a broken tooth on right side

## 2012-07-08 NOTE — ED Notes (Signed)
Pt c/o flu like symptoms for past 3 or 4 days.  Reports cough, generalized body aches, and chills.  Today c/o r jaw pain radiating into r side of head.

## 2012-07-08 NOTE — ED Provider Notes (Signed)
History     This chart was scribed for Jeff Octave, MD, MD by Smitty Pluck, ED Scribe. The patient was seen in room APA02/APA02 and the patient's care was started at 12:49 PM.   CSN: [id number]  Arrival date & time 07/08/12  1140      Chief Complaint  Patient presents with  . URI     The history is provided by the patient. No language interpreter was used.   Jeff Walker is a 47 y.o. male who presents to the Emergency Department complaining of constant, moderate generalized body aches onset 3-4 days ago. Pt reports having tactile fever. He is unsure of temperature. He states he has moderate cough and post tussive vomiting. He reports having gradual right sided headache onset today. He reports having decreased appetite. He mentions having right lower dental pain and swelling.  Pt denies sore throat, trouble swallowing, abdominal pain, diarrhea, weakness, SOB and any other pain.  Pt reports he smokes cigarettes.   History reviewed. No pertinent past medical history.  Past Surgical History  Procedure Laterality Date  . Esophagus surgery      Family History  Problem Relation Age of Onset  . Hypertension Father   . Cancer Father     History  Substance Use Topics  . Smoking status: Current Every Day Smoker -- 2.00 packs/day    Types: Cigarettes  . Smokeless tobacco: Not on file  . Alcohol Use: Yes     Comment: 6 pack per week beer      Review of Systems 10 Systems reviewed and all are negative for acute change except as noted in the HPI.   Allergies  Review of patient's allergies indicates no known allergies.  Home Medications   Current Outpatient Rx  Name  Route  Sig  Dispense  Refill  . ibuprofen (ADVIL,MOTRIN) 200 MG tablet   Oral   Take 400 mg by mouth once as needed. For pain          . predniSONE (DELTASONE) 10 MG tablet   Oral   Take 2 tablets (20 mg total) by mouth daily.   15 tablet   0     BP 149/100  Temp(Src) 98.1 F (36.7 C) (Oral)   Resp 20  Ht 6' (1.829 m)  Wt 160 lb (72.576 kg)  BMI 21.7 kg/m2  SpO2 100%  Physical Exam  Nursing note and vitals reviewed. Constitutional: He is oriented to person, place, and time. He appears well-developed and well-nourished. No distress.  HENT:  Head: Normocephalic and atraumatic.  Right sided jaw swelling with induration next to right lower molar floor of mouth is soft    Eyes: EOM are normal. Pupils are equal, round, and reactive to light.  Neck: Normal range of motion. Neck supple. No tracheal deviation present.  No meningismus    Cardiovascular: Normal rate.   No murmur heard. Pulmonary/Chest: Effort normal. No respiratory distress. He has no wheezes. He has no rales.  Dry cough Pectus excavatum   Abdominal: Soft. He exhibits no distension. There is no tenderness. There is no rebound and no guarding.  Musculoskeletal: Normal range of motion.  Neurological: He is alert and oriented to person, place, and time.  Skin: Skin is warm and dry.  Psychiatric: He has a normal mood and affect. His behavior is normal.    ED Course  Procedures (including critical care time) DIAGNOSTIC STUDIES: Oxygen Saturation is 100% on room air, normal by my interpretation.    COORDINATION  OF CARE: 12:52 PM Discussed ED treatment with pt and pt agrees.     Labs Reviewed - No data to display Dg Chest 2 View  07/08/2012  *RADIOLOGY REPORT*  Clinical Data: Cough and fever  CHEST - 2 VIEW  Comparison: April 25, 2012  Findings:  There is thoracic dextrorotoscoliosis.  There is evidence of old rib trauma on the right.  There is underlying emphysema.  There is no edema or consolidation. Heart size and pulmonary vascular normal.  No adenopathy.  IMPRESSION: Underlying emphysema.  No edema or consolidation.  Scoliosis.   Original Report Authenticated By: Bretta Bang, M.D.      No diagnosis found.    MDM  3 days of gradual onset of body aches, headache, chills and tactile fevers.  Right-sided jaw swelling today. No difficulty breathing or swallowing. Cough is productive of yellow and white mucous. Afebrile. No distress, lungs clear bilaterally. Early right-sided dental abscess without evidence of Ludwig's angina.  Chest x-ray negative. Patient does have positive orthostatics. His influenza-like illness with dental abscess simultaneously. No murmur. No IV drug abuse. No skin lesions. Endocarditis considered but deemed less likely. Tolerating PO in the ED, no vomiting.  Supportive care for viral syndrome, antibiotics for dental abscess. Followup with PCP and dentist.  Date: 07/08/2012  Rate: 89  Rhythm: normal sinus rhythm  QRS Axis: normal  Intervals: normal  ST/T Wave abnormalities: normal  Conduction Disutrbances:right bundle branch block  Narrative Interpretation: new RBBB  Old EKG Reviewed: changes noted    I personally performed the services described in this documentation, which was scribed in my presence. The recorded information has been reviewed and is accurate.    Jeff Octave, MD 07/08/12 1537

## 2012-07-08 NOTE — ED Notes (Signed)
Pt with flu like symptoms x 2-3 days per pt-cough, chills and body aches, states took mucinex and tylenol yesterday, denies taking any meds today for symptoms

## 2013-02-13 ENCOUNTER — Emergency Department (HOSPITAL_COMMUNITY): Payer: Self-pay

## 2013-02-13 ENCOUNTER — Emergency Department (HOSPITAL_COMMUNITY)
Admission: EM | Admit: 2013-02-13 | Discharge: 2013-02-13 | Disposition: A | Discharge status: Home / Self Care | Payer: Self-pay | Attending: Emergency Medicine | Admitting: Emergency Medicine

## 2013-02-13 ENCOUNTER — Encounter (HOSPITAL_COMMUNITY): Payer: Self-pay | Admitting: *Deleted

## 2013-02-13 DIAGNOSIS — R079 Chest pain, unspecified: Secondary | ICD-10-CM | POA: Insufficient documentation

## 2013-02-13 DIAGNOSIS — F17200 Nicotine dependence, unspecified, uncomplicated: Secondary | ICD-10-CM | POA: Insufficient documentation

## 2013-02-13 LAB — CBC WITH DIFFERENTIAL/PLATELET
Basophils Absolute: 0.1 10*3/uL (ref 0.0–0.1)
Basophils Relative: 1 % (ref 0–1)
Eosinophils Absolute: 0.1 10*3/uL (ref 0.0–0.7)
Eosinophils Relative: 2 % (ref 0–5)
HCT: 45.2 % (ref 39.0–52.0)
Hemoglobin: 16.2 g/dL (ref 13.0–17.0)
Lymphocytes Relative: 42 % (ref 12–46)
Lymphs Abs: 3.6 10*3/uL (ref 0.7–4.0)
MCH: 32.7 pg (ref 26.0–34.0)
MCHC: 35.8 g/dL (ref 30.0–36.0)
MCV: 91.1 fL (ref 78.0–100.0)
Monocytes Absolute: 0.4 10*3/uL (ref 0.1–1.0)
Monocytes Relative: 4 % (ref 3–12)
Neutro Abs: 4.4 10*3/uL (ref 1.7–7.7)
Neutrophils Relative %: 52 % (ref 43–77)
Platelets: 293 10*3/uL (ref 150–400)
RBC: 4.96 MIL/uL (ref 4.22–5.81)
RDW: 12.4 % (ref 11.5–15.5)
WBC: 8.5 10*3/uL (ref 4.0–10.5)

## 2013-02-13 LAB — BASIC METABOLIC PANEL
BUN: 9 mg/dL (ref 6–23)
CO2: 26 mEq/L (ref 19–32)
Calcium: 9.5 mg/dL (ref 8.4–10.5)
Chloride: 99 mEq/L (ref 96–112)
Creatinine, Ser: 1.08 mg/dL (ref 0.50–1.35)
GFR calc Af Amer: >90 mL/min (ref >90)
GFR calc non Af Amer: 80 mL/min — ABNORMAL LOW (ref >90)
Glucose, Bld: 125 mg/dL — ABNORMAL HIGH (ref 70–99)
Potassium: 3.4 mEq/L — ABNORMAL LOW (ref 3.5–5.1)
Sodium: 139 mEq/L (ref 135–145)

## 2013-02-13 LAB — TROPONIN I: Troponin I: <0.3 ng/mL (ref <0.30)

## 2013-02-13 MED ORDER — ASPIRIN 81 MG PO CHEW
324.0000 mg | CHEWABLE_TABLET | Freq: Once | ORAL | Status: AC
  Administered 2013-02-13: 324 mg via ORAL
  Filled 2013-02-13: qty 4

## 2013-02-13 NOTE — ED Notes (Addendum)
Chest pain  Since yesterday afternoon,  Sl nausea.  Lt arm feels tingly "  Had chest cold last week.

## 2013-02-23 NOTE — ED Provider Notes (Signed)
CSN: [id number]     Arrival date & time 02/13/13  1842 History   First MD Initiated Contact with Patient 02/13/13 1851     Chief Complaint  Patient presents with  . Chest Pain   (Consider location/radiation/quality/duration/timing/severity/associated sxs/prior Treatment) HPI  47 year old male with chest pain. Gradual onset yesterday. Patient cannot remember what he was doing at onset. The pain is achy and occasionally sharp. Does not radiate. No appreciable exacerbating or relieving factors. Occasional nonproductive cough. No shortness of breath. No fevers or chills. No unusual leg pain or swelling. No intervention prior to arrival. Is a smoker. Denies drug use.   History reviewed. No pertinent past medical history. Past Surgical History  Procedure Laterality Date  . Esophagus surgery     Family History  Problem Relation Age of Onset  . Hypertension Father   . Cancer Father    History  Substance Use Topics  . Smoking status: Current Every Day Smoker -- 2.00 packs/day    Types: Cigarettes  . Smokeless tobacco: Not on file  . Alcohol Use: Yes     Comment: 6 pack per week beer    Review of Systems  All systems reviewed and negative, other than as noted in HPI.   Allergies  Review of patient's allergies indicates no known allergies.  Home Medications   Current Outpatient Rx  Name  Route  Sig  Dispense  Refill  . ibuprofen (ADVIL,MOTRIN) 200 MG tablet   Oral   Take 400 mg by mouth once as needed. For pain           BP 143/90  Pulse 94  Temp(Src) 98.2 F (36.8 C) (Oral)  Resp 16  Ht 6' (1.829 m)  Wt 167 lb 4 oz (75.864 kg)  BMI 22.68 kg/m2  SpO2 96% Physical Exam  Nursing note and vitals reviewed. Constitutional: He appears well-developed and well-nourished. No distress.  HENT:  Head: Normocephalic and atraumatic.  Eyes: Conjunctivae are normal. Right eye exhibits no discharge. Left eye exhibits no discharge.  Neck: Neck supple.  Cardiovascular: Normal  rate, regular rhythm and normal heart sounds.  Exam reveals no gallop and no friction rub.   No murmur heard. Pulmonary/Chest: Effort normal and breath sounds normal. No respiratory distress. He exhibits no tenderness.  Abdominal: Soft. He exhibits no distension. There is no tenderness.  Musculoskeletal: He exhibits no edema and no tenderness.  Lower extremities symmetric as compared to each other. No calf tenderness. Negative Homan's. No palpable cords.   Neurological: He is alert.  Skin: Skin is warm and dry.  Psychiatric: He has a normal mood and affect. His behavior is normal. Thought content normal.    ED Course  Procedures (including critical care time) Labs Review Labs Reviewed  BASIC METABOLIC PANEL - Abnormal; Notable for the following:    Potassium 3.4 (*)    Glucose, Bld 125 (*)    GFR calc non Af Amer 80 (*)    All other components within normal limits  CBC WITH DIFFERENTIAL  TROPONIN I   Imaging Review No results found.  EKG Interpretation   None      EKG:  Rhythm: Normal sinus rhythm Vent. rate 92 BPM PR interval 138 ms QRS duration 94 ms QT/QTc 360/445 ms Biatrial enlargement ST segments: Nonspecific ST changes  Dg Chest 2 View  02/13/2013   CLINICAL DATA:  Chest pain  EXAM: CHEST  2 VIEW  COMPARISON:  Chest radiograph 07/08/2012, 04/25/2012  FINDINGS: Normal cardiac silhouette. Lungs are  hyperinflated. There is the dysplastic versus posttraumatic right 2nd rib again demonstrated. No effusion, infiltrate, or pneumothorax.  IMPRESSION: No active cardiopulmonary disease.  Emphysematous change.   Electronically Signed   By: Genevive Bi M.D.   On: 02/13/2013 19:44    MDM   1. Chest pain    .47yM with CP. Doubt ACS with constant duration since yesterday. EKG w/o ischemic changes. Trop normal CXR clear. Doubt PE, infectious or dissection. Symptomatic tx. Outpt FU. And    Raeford Razor, MD 02/23/13 0930

## 2014-05-29 ENCOUNTER — Emergency Department (HOSPITAL_COMMUNITY): Payer: Self-pay

## 2014-05-29 ENCOUNTER — Emergency Department (HOSPITAL_COMMUNITY)
Admission: EM | Admit: 2014-05-29 | Discharge: 2014-05-29 | Disposition: A | Discharge status: Home / Self Care | Payer: Self-pay | Attending: Emergency Medicine | Admitting: Emergency Medicine

## 2014-05-29 ENCOUNTER — Encounter (HOSPITAL_COMMUNITY): Payer: Self-pay | Admitting: *Deleted

## 2014-05-29 DIAGNOSIS — R002 Palpitations: Secondary | ICD-10-CM | POA: Insufficient documentation

## 2014-05-29 DIAGNOSIS — R0789 Other chest pain: Secondary | ICD-10-CM | POA: Insufficient documentation

## 2014-05-29 DIAGNOSIS — Z720 Tobacco use: Secondary | ICD-10-CM | POA: Insufficient documentation

## 2014-05-29 DIAGNOSIS — Z79899 Other long term (current) drug therapy: Secondary | ICD-10-CM | POA: Insufficient documentation

## 2014-05-29 DIAGNOSIS — R0981 Nasal congestion: Secondary | ICD-10-CM | POA: Insufficient documentation

## 2014-05-29 LAB — CBC
HEMATOCRIT: 48.7 % (ref 39.0–52.0)
HEMOGLOBIN: 16.6 g/dL (ref 13.0–17.0)
MCH: 32.1 pg (ref 26.0–34.0)
MCHC: 34.1 g/dL (ref 30.0–36.0)
MCV: 94.2 fL (ref 78.0–100.0)
Platelets: 294 10*3/uL (ref 150–400)
RBC: 5.17 MIL/uL (ref 4.22–5.81)
RDW: 12.5 % (ref 11.5–15.5)
WBC: 11 10*3/uL — AB (ref 4.0–10.5)

## 2014-05-29 LAB — BASIC METABOLIC PANEL
ANION GAP: 8 (ref 5–15)
BUN: 12 mg/dL (ref 6–23)
CHLORIDE: 106 meq/L (ref 96–112)
CO2: 25 mmol/L (ref 19–32)
CREATININE: 1.22 mg/dL (ref 0.50–1.35)
Calcium: 9.2 mg/dL (ref 8.4–10.5)
GFR calc Af Amer: 79 mL/min — ABNORMAL LOW (ref >90)
GFR, EST NON AFRICAN AMERICAN: 69 mL/min — AB (ref >90)
GLUCOSE: 110 mg/dL — AB (ref 70–99)
POTASSIUM: 3.8 mmol/L (ref 3.5–5.1)
Sodium: 139 mmol/L (ref 135–145)

## 2014-05-29 LAB — TROPONIN I: Troponin I: <0.03 ng/mL (ref <0.031)

## 2014-05-29 LAB — HEPATIC FUNCTION PANEL
ALK PHOS: 68 U/L (ref 39–117)
ALT: 20 U/L (ref 0–53)
AST: 21 U/L (ref 0–37)
Albumin: 4.4 g/dL (ref 3.5–5.2)
BILIRUBIN DIRECT: 0.1 mg/dL (ref 0.0–0.3)
BILIRUBIN INDIRECT: 0.6 mg/dL (ref 0.3–0.9)
BILIRUBIN TOTAL: 0.7 mg/dL (ref 0.3–1.2)
Total Protein: 7.4 g/dL (ref 6.0–8.3)

## 2014-05-29 LAB — LIPASE, BLOOD: LIPASE: 19 U/L (ref 11–59)

## 2014-05-29 MED ORDER — OMEPRAZOLE 20 MG PO CPDR: 20.0000 mg | DELAYED_RELEASE_CAPSULE | Freq: Every day | ORAL | Status: DC

## 2014-05-29 MED ORDER — GI COCKTAIL ~~LOC~~
30.0000 mL | Freq: Once | ORAL | Status: AC
  Administered 2014-05-29: 30 mL via ORAL
  Filled 2014-05-29: qty 30

## 2014-05-29 NOTE — Discharge Instructions (Signed)
Chest Pain (Nonspecific) There is no evidence of heart attack or blood clot in the lung. Follow-up with your doctor for a stress test. Return to the ED if you develop new or worsening symptoms. It is often hard to give a diagnosis for the cause of chest pain. There is always a chance that your pain could be related to something serious, such as a heart attack or a blood clot in the lungs. You need to follow up with your doctor. HOME CARE  If antibiotic medicine was given, take it as directed by your doctor. Finish the medicine even if you start to feel better.  For the next few days, avoid activities that bring on chest pain. Continue physical activities as told by your doctor.  Do not use any tobacco products. This includes cigarettes, chewing tobacco, and e-cigarettes.  Avoid drinking alcohol.  Only take medicine as told by your doctor.  Follow your doctor's suggestions for more testing if your chest pain does not go away.  Keep all doctor visits you made. GET HELP IF:  Your chest pain does not go away, even after treatment.  You have a rash with blisters on your chest.  You have a fever. GET HELP RIGHT AWAY IF:   You have more pain or pain that spreads to your arm, neck, jaw, back, or belly (abdomen).  You have shortness of breath.  You cough more than usual or cough up blood.  You have very bad back or belly pain.  You feel sick to your stomach (nauseous) or throw up (vomit).  You have very bad weakness.  You pass out (faint).  You have chills. This is an emergency. Do not wait to see if the problems will go away. Call your local emergency services (911 in [place] Do not drive yourself to the hospital. MAKE SURE YOU:   Understand these instructions.  Will watch your condition.  Will get help right away if you are not doing well or get worse. Document Released: 10/17/2007 Document Revised: 05/05/2013 Document Reviewed: 10/17/2007 [facility] Patient Information  2015 [employer], [place]. This information is not intended to replace advice given to you by your health care provider. Make sure you discuss any questions you have with your health care provider.

## 2014-05-29 NOTE — ED Notes (Signed)
Chest pain onset yesterday 

## 2014-05-29 NOTE — ED Notes (Signed)
MD at bedside. 

## 2014-05-29 NOTE — ED Provider Notes (Signed)
CSN: [id number]     Arrival date & time 05/29/14  1303 History  This chart was scribed for [name], MD by Freida Busman, ED Scribe. This patient was seen in room APA10/APA10 and the patient's care was started 1:40 PM.    Chief Complaint  Patient presents with  . Chest Pain    The history is provided by the patient. No language interpreter was used.     HPI Comments:  Jeff Walker is a 49 y.o. male who presents to the Emergency Department complaining of intermittent palpitations that started yesterday afternoon and non-radiating left sided CP that he describes as a pressure which has been constant since onset yesterday evening. Pt states EMS checked his HR yesterday and it was over 150 bpm; he was advised to seek medical attention yesterday but he declined. He denies SOB and diaphoresis. He also denies h/o similar symptoms and cardiac hx.  No modifying factors noted. Pt also notes recent cold with occasionally productive cough, congestion and sinus pressure which he has been taking OTC cough medication for with minimal relief.   History reviewed. No pertinent past medical history. Past Surgical History  Procedure Laterality Date  . Esophagus surgery     Family History  Problem Relation Age of Onset  . Hypertension Father   . Cancer Father    History  Substance Use Topics  . Smoking status: Current Every Day Smoker -- 2.00 packs/day    Types: Cigarettes  . Smokeless tobacco: Not on file  . Alcohol Use: Yes     Comment: 6 pack per week beer    Review of Systems  Constitutional: Negative for diaphoresis.  HENT: Positive for congestion and sinus pressure.   Respiratory: Positive for cough. Negative for shortness of breath.   Cardiovascular: Positive for chest pain and palpitations.  Gastrointestinal: Negative for abdominal pain.      Allergies  Review of patient's allergies indicates no known allergies.  Home Medications   Prior to Admission medications    Medication Sig Start Date End Date Taking? Authorizing Provider  ibuprofen (ADVIL,MOTRIN) 200 MG tablet Take 400 mg by mouth once as needed. For pain    Yes Historical Provider, MD  omeprazole (PRILOSEC) 20 MG capsule Take 1 capsule (20 mg total) by mouth daily. 05/29/14   [name], MD   BP 143/97 mmHg  Pulse 77  Temp(Src) 98.9 F (37.2 C) (Oral)  Resp 15  Ht 6' (1.829 m)  Wt 180 lb (81.647 kg)  BMI 24.41 kg/m2  SpO2 100% Physical Exam  Constitutional: He is oriented to person, place, and time. He appears well-developed and well-nourished. No distress.  HENT:  Head: Normocephalic and atraumatic.  Mouth/Throat: Oropharynx is clear and moist. No oropharyngeal exudate.  Eyes: Conjunctivae and EOM are normal. Pupils are equal, round, and reactive to light.  Neck: Normal range of motion. Neck supple.  No meningismus.  Cardiovascular: Normal rate, regular rhythm, normal heart sounds and intact distal pulses.   No murmur heard. Pulmonary/Chest: Effort normal and breath sounds normal. No respiratory distress. He exhibits no tenderness.  Pectus excavatum   Abdominal: Soft. There is no tenderness. There is no rebound and no guarding.  Musculoskeletal: Normal range of motion. He exhibits no edema or tenderness.  Neurological: He is alert and oriented to person, place, and time. No cranial nerve deficit. He exhibits normal muscle tone. Coordination normal.  No ataxia on finger to nose bilaterally. No pronator drift. 5/5 strength throughout. CN 2-12 intact. Negative  Romberg. Equal grip strength. Sensation intact. Gait is normal.   Skin: Skin is warm.  Psychiatric: He has a normal mood and affect. His behavior is normal.  Nursing note and vitals reviewed.   ED Course  Procedures   DIAGNOSTIC STUDIES:  Oxygen Saturation is 100% on RA, normal by my interpretation.    COORDINATION OF CARE:  1:43 PM Discussed treatment plan with pt at bedside and pt agreed to plan.  Labs  Review Labs Reviewed  CBC - Abnormal; Notable for the following:    WBC 11.0 (*)    All other components within normal limits  BASIC METABOLIC PANEL - Abnormal; Notable for the following:    Glucose, Bld 110 (*)    GFR calc non Af Amer 69 (*)    GFR calc Af Amer 79 (*)    All other components within normal limits  TROPONIN I  HEPATIC FUNCTION PANEL  LIPASE, BLOOD  TROPONIN I    Imaging Review Dg Chest Port 1 View  05/29/2014   CLINICAL DATA:  Left side chest pain, cough, congestion, fever. Smoker.  EXAM: PORTABLE CHEST - 1 VIEW  COMPARISON:  02/13/2013  FINDINGS: The heart size and mediastinal contours are within normal limits. Both lungs are clear. The visualized skeletal structures are unremarkable.  IMPRESSION: No active disease.   Electronically Signed   By: Charlett NoseKevin  Dover M.D.   On: 05/29/2014 14:11     EKG Interpretation None      MDM   Final diagnoses:  Atypical chest pain   constant left-sided chest pressure since last night. Associated with racing heart which has since resolved. Denies shortness of breath, nausea or diaphoresis. EKG normal sinus rhythm. No previous cardiac history.  Pain has been ongoing and constant since yesterday. It is atypical for ACS or PE.  EKG unchanged. Troponin negative x2. No hypoxia or tachycardia.  Suspect palpitations possibly due to decongestant use.  Constant ongoing chest pressure since last night.  No change with GI cocktail. Outpatient stress test recommended.  Return precautions discussed.   Date:   Rate: 97  Rhythm: normal sinus rhythm  QRS Axis: normal  Intervals: normal  ST/T Wave abnormalities: normal  Conduction Disutrbances:none  Narrative Interpretation:   Old EKG Reviewed: unchanged      I personally performed the services described in this documentation, which was scribed in my presence. The recorded information has been reviewed and is accurate.     Glynn OctaveStephen Naiah Donahoe, MD 05/29/14 [phone]

## 2015-01-24 ENCOUNTER — Ambulatory Visit (INDEPENDENT_AMBULATORY_CARE_PROVIDER_SITE_OTHER): Payer: Worker's Compensation

## 2015-01-24 ENCOUNTER — Encounter: Payer: Self-pay | Admitting: Family Medicine

## 2015-01-24 ENCOUNTER — Ambulatory Visit (INDEPENDENT_AMBULATORY_CARE_PROVIDER_SITE_OTHER): Payer: Worker's Compensation | Admitting: Family Medicine

## 2015-01-24 VITALS — BP 136/86 | HR 79 | Temp 97.2°F | Wt 177.2 lb

## 2015-01-24 DIAGNOSIS — S8001XA Contusion of right knee, initial encounter: Secondary | ICD-10-CM | POA: Diagnosis not present

## 2015-01-24 DIAGNOSIS — M25561 Pain in right knee: Secondary | ICD-10-CM

## 2015-01-24 MED ORDER — DICLOFENAC SODIUM 75 MG PO TBEC: 75.0000 mg | DELAYED_RELEASE_TABLET | Freq: Two times a day (BID) | ORAL | Status: DC

## 2015-01-24 NOTE — Progress Notes (Addendum)
Subjective:  Patient ID: Jeff Walker, male    DOB: 12/27/1965  Age: 49 y.o. MRN: [id number]  CC: Knee Pain   HPI LAMORRIS KNOBLOCK presents for [employer] for injury earlier today at [employer]. He was stepping down from a flat bed truck and misstepped and fell forward onto his knee. He points to that medial joint line of the right knee. Pain is moderately severe. It's hard to put weight on it at this time.  History Saliou has no past medical history on file.   He has past surgical history that includes Esophagus surgery.   His family history includes Cancer in his father; Hypertension in his father.He reports that he has been smoking Cigarettes.  He has been smoking about 2.00 packs per day. He does not have any smokeless tobacco history on file. He reports that he drinks alcohol. He reports that he does not use illicit drugs.  Outpatient Prescriptions Prior to Visit  Medication Sig Dispense Refill  . ibuprofen (ADVIL,MOTRIN) 200 MG tablet Take 400 mg by mouth once as needed. For pain     . omeprazole (PRILOSEC) 20 MG capsule Take 1 capsule (20 mg total) by mouth daily. (Patient not taking: Reported on 01/24/2015) 30 capsule 0   No facility-administered medications prior to visit.    ROS Review of Systems  Constitutional: Positive for activity change. Negative for fever and appetite change.  Respiratory: Negative for cough and shortness of breath.   Cardiovascular: Negative for chest pain.  Gastrointestinal: Negative for abdominal pain.  Musculoskeletal: Positive for arthralgias. Negative for myalgias and back pain.    Objective:  BP 136/86 mmHg  Pulse 79  Temp(Src) 97.2 F (36.2 C) (Oral)  Wt 177 lb 3.2 oz (80.377 kg)  BP Readings from Last 3 Encounters:  01/24/15 136/86  05/29/14 143/97  02/13/13 143/90    Wt Readings from Last 3 Encounters:  01/24/15 177 lb 3.2 oz (80.377 kg)  05/29/14 180 lb (81.647 kg)  02/13/13 167 lb 4 oz (75.864 kg)      Physical Exam  Constitutional: He appears well-developed and well-nourished.  HENT:  Head: Normocephalic and atraumatic.  Mouth/Throat: No posterior oropharyngeal erythema.  Eyes: Pupils are equal, round, and reactive to light.  Neck: Normal range of motion.  Cardiovascular: Normal rate and regular rhythm.   No murmur heard. Pulmonary/Chest: Breath sounds normal. No respiratory distress.  Abdominal: Bowel sounds are normal.  Musculoskeletal: He exhibits edema and tenderness (2+ edema at the lateral aspect of the right knee with marked tenderness at the anteromedail joint line. ).  Negative Lachman and McMurray. Painful for valgus stress maneuver.  Vitals reviewed.   No results found for: HGBA1C  Lab Results  Component Value Date   WBC 11.0* 05/29/2014   HGB 16.6 05/29/2014   HCT 48.7 05/29/2014   PLT 294 05/29/2014   GLUCOSE 110* 05/29/2014   ALT 20 05/29/2014   AST 21 05/29/2014   NA 139 05/29/2014   K 3.8 05/29/2014   CL 106 05/29/2014   CREATININE 1.22 05/29/2014   BUN 12 05/29/2014   CO2 25 05/29/2014    Dg Chest Port 1 View  05/29/2014   CLINICAL DATA:  Left side chest pain, cough, congestion, fever. Smoker.  EXAM: PORTABLE CHEST - 1 VIEW  COMPARISON:  02/13/2013  FINDINGS: The heart size and mediastinal contours are within normal limits. Both lungs are clear. The visualized skeletal structures are unremarkable.  IMPRESSION: No active disease.   Electronically Signed  By: Charlett Nose M.D.   On: 05/29/2014 14:11    Assessment & Plan:   Haedyn was seen today for knee pain.  Diagnoses and all orders for this visit:  Contusion, knee, right, initial encounter -     DME Crutches  Right knee pain -     DG Knee 1-2 Views Right -     DME Crutches  Other orders -     diclofenac (VOLTAREN) 75 MG EC tablet; Take 1 tablet (75 mg total) by mouth 2 (two) times daily.   I have discontinued Mr. Mederos ibuprofen and omeprazole. I am also having him start on  diclofenac.  Meds ordered this encounter  Medications  . diclofenac (VOLTAREN) 75 MG EC tablet    Sig: Take 1 tablet (75 mg total) by mouth 2 (two) times daily.    Dispense:  60 tablet    Refill:  2   Pt. Should ambulate with crutches. He should be seated primarily at work. He may stand and walk short distances briefly. Written work restriction form was given to the patient. Copy is appended/ scanned. Preliminary reading done by Farris Has. No acute fracture noted. STS noted.  Follow-up: Return in about 1 week (around 01/31/2015).  Mechele Claude, M.D.

## 2015-01-26 NOTE — Addendum Note (Signed)
Addended by: Mechele Claude on: 01/26/2015 09:58 PM   Modules accepted: Orders, SmartSet

## 2015-01-31 ENCOUNTER — Ambulatory Visit (INDEPENDENT_AMBULATORY_CARE_PROVIDER_SITE_OTHER): Payer: Worker's Compensation | Admitting: Family Medicine

## 2015-01-31 ENCOUNTER — Encounter: Payer: Self-pay | Admitting: Family Medicine

## 2015-01-31 VITALS — BP 139/92 | HR 69 | Temp 97.2°F | Ht 72.0 in | Wt 171.2 lb

## 2015-01-31 DIAGNOSIS — S8001XD Contusion of right knee, subsequent encounter: Secondary | ICD-10-CM

## 2015-01-31 NOTE — Progress Notes (Addendum)
   Subjective:  Patient ID: Jeff Walker, male    DOB: [birth year]  Age: 49 y.o. MRN: [id number]  CC: Knee Pain   HPI Jeff Walker presents for recheck of right knee. Rested it for a week. Used a cane for ambulation. Now pain is 2/10  - baseline for him. He is able to walk now somewhat. He has not tried full activity at work as yet but thinks he can.  History Jeff Walker has no past medical history on file.   He has past surgical history that includes Esophagus surgery.   His family history includes Cancer in his father; Hypertension in his father.He reports that he has been smoking Cigarettes.  He has been smoking about 2.00 packs per day. He does not have any smokeless tobacco history on file. He reports that he drinks alcohol. He reports that he does not use illicit drugs.  Current Outpatient Prescriptions on File Prior to Visit  Medication Sig Dispense Refill  . diclofenac (VOLTAREN) 75 MG EC tablet Take 1 tablet (75 mg total) by mouth 2 (two) times daily. 60 tablet 2   No current facility-administered medications on file prior to visit.    ROS Review of Systems  Constitutional: Negative for fever, chills and diaphoresis.  HENT: Negative for congestion, rhinorrhea and sore throat.   Respiratory: Negative for cough, shortness of breath and wheezing.   Cardiovascular: Negative for chest pain.  Gastrointestinal: Negative for nausea, vomiting, abdominal pain, diarrhea, constipation and abdominal distention.  Genitourinary: Negative for dysuria and frequency.  Musculoskeletal: Positive for arthralgias (See history of present illness).  Skin: Negative for rash.  Neurological: Negative for headaches.    Objective:  BP 139/92 mmHg  Pulse 69  Temp(Src) 97.2 F (36.2 C) (Oral)  Ht 6' (1.829 m)  Wt 171 lb 3.2 oz (77.656 kg)  BMI 23.21 kg/m2  Physical Exam  Constitutional: He appears well-developed and well-nourished.  HENT:  Head: Normocephalic and atraumatic.  Right Ear:  External ear normal.  Left Ear: External ear normal.  Mouth/Throat: No oropharyngeal exudate or posterior oropharyngeal erythema.  Eyes: Pupils are equal, round, and reactive to light.  Neck: Normal range of motion. Neck supple.  Cardiovascular: Normal rate and regular rhythm.   No murmur heard. Pulmonary/Chest: Breath sounds normal. No respiratory distress.  Abdominal: Bowel sounds are normal.  Musculoskeletal: Normal range of motion. He exhibits no tenderness (there is full range of motion at the right knee. It is nontender. There is no edema or hematoma. The [facility] and collateral stress maneuvers are all within normal limits).  Neurological: He is alert.  Vitals reviewed.   Assessment & Plan:   Jeff Walker was seen today for knee pain.  Diagnoses and all orders for this visit:  Contusion, knee, right, initial encounter   I am having Mr. Jeff Walker maintain his diclofenac.  No orders of the defined types were placed in this encounter.   Pt. To return to  Normal duties at work. Normal activities at home.  Follow-up: Return if symptoms worsen or fail to improve, for Pain.  Mechele Claude, M.D.

## 2015-02-11 NOTE — Addendum Note (Signed)
Addended by: Mechele Claude on: 02/11/2015 10:46 AM   Modules accepted: Kipp Brood

## 2016-08-23 ENCOUNTER — Encounter: Payer: Self-pay | Admitting: Family Medicine

## 2016-08-23 ENCOUNTER — Encounter (INDEPENDENT_AMBULATORY_CARE_PROVIDER_SITE_OTHER): Payer: Self-pay

## 2016-08-23 ENCOUNTER — Ambulatory Visit (INDEPENDENT_AMBULATORY_CARE_PROVIDER_SITE_OTHER): Payer: BLUE CROSS/BLUE SHIELD | Admitting: Family Medicine

## 2016-08-23 VITALS — BP 157/96 | HR 100 | Temp 98.0°F | Ht 72.0 in | Wt 174.0 lb

## 2016-08-23 DIAGNOSIS — F17200 Nicotine dependence, unspecified, uncomplicated: Secondary | ICD-10-CM | POA: Diagnosis not present

## 2016-08-23 DIAGNOSIS — I10 Essential (primary) hypertension: Secondary | ICD-10-CM

## 2016-08-23 DIAGNOSIS — J40 Bronchitis, not specified as acute or chronic: Secondary | ICD-10-CM | POA: Diagnosis not present

## 2016-08-23 DIAGNOSIS — K219 Gastro-esophageal reflux disease without esophagitis: Secondary | ICD-10-CM

## 2016-08-23 MED ORDER — ALBUTEROL SULFATE HFA 108 (90 BASE) MCG/ACT IN AERS: 2.0000 | INHALATION_SPRAY | Freq: Four times a day (QID) | RESPIRATORY_TRACT | 0 refills | Status: DC | PRN

## 2016-08-23 MED ORDER — AZITHROMYCIN 250 MG PO TABS: ORAL_TABLET | ORAL | 0 refills | Status: DC

## 2016-08-23 MED ORDER — VARENICLINE TARTRATE 1 MG PO TABS: 1.0000 mg | ORAL_TABLET | Freq: Two times a day (BID) | ORAL | 1 refills | Status: DC

## 2016-08-23 MED ORDER — PREDNISONE 20 MG PO TABS: ORAL_TABLET | ORAL | 0 refills | Status: DC

## 2016-08-23 MED ORDER — OMEPRAZOLE 20 MG PO CPDR: 20.0000 mg | DELAYED_RELEASE_CAPSULE | Freq: Every day | ORAL | 3 refills | Status: DC

## 2016-08-23 MED ORDER — VARENICLINE TARTRATE 0.5 MG X 11 & 1 MG X 42 PO MISC: ORAL | 0 refills | Status: DC

## 2016-08-23 MED ORDER — HYDROCODONE-HOMATROPINE 5-1.5 MG/5ML PO SYRP: 5.0000 mL | ORAL_SOLUTION | Freq: Four times a day (QID) | ORAL | 0 refills | Status: DC | PRN

## 2016-08-23 NOTE — Progress Notes (Signed)
BP (!) 157/96   Pulse 100   Temp 98 F (36.7 C) (Oral)   Ht 6' (1.829 m)   Wt 174 lb (78.9 kg)   BMI 23.60 kg/m    Subjective:    Patient ID: Jeff Walker, male    DOB: May 23, 1965, 51 y.o.   MRN: [id number]  HPI: Jeff Walker is a 51 y.o. male presenting on 08/23/2016 for Establish Care (discuss Chantix, BP has been elevated for years but no one has ever given him anything); Sinusitis (nasal congestion, runny nose; ongoing for about 5 weeks); and Cough, chest congestion (taking Tylenol Severe Cold and Flu, Mucinex)   HPI Chest congestion and sinus pressure Patient is coming in with complaints of chest congestion and sinus pressure this been going on for the past 5 weeks and does not seem to be improving. He has been using Tylenol severe cold and flu and Mucinex and feels like they helped some but not completely. He feels like over the past week it has worsened. He does admit that he is a smoker and currently smokes 2 packs per day and is ready to quit and would like to try Chantix. He says he has quit using other things before but has come right back to smoking very rapidly. He does think that he is having some wheezing and some shortness of breath. He denies any fevers or chills over the past week.  Hypertension Patient has had elevated blood pressure in the 140s to 150s for at least the past few years that he knows of. He has gone to urgent cares on multiple occasions and has been told that his blood pressures been elevated but he did not have a primary care physician to follow up with on this. He does admit that he gets some headaches but denies any chest pain or pain in his arm or jaw. He does get short of breath more recently with the chest congestion that he's had going on and wheezing that is worse with exertion. He says that he did have a stress test more than 5 years ago but not recently. He has never been tried on any medication for his blood pressure before.  Abdominal  indigestion and regurgitation Patient has been having abdominal indigestion in the upper part of his abdomen and has a sensation that his food gets stuck right before going into the stomach and sometimes comes back up as well. Says is mostly food and does not admit to liquids causing this issue. He denies any blood in his stool or diarrhea or constipation.  Relevant past medical, surgical, family and social history reviewed and updated as indicated. Interim medical history since our last visit reviewed. Allergies and medications reviewed and updated.  Review of Systems  Constitutional: Negative for chills and fever.  HENT: Positive for congestion, postnasal drip, rhinorrhea, sinus pressure, sneezing and sore throat. Negative for ear discharge, ear pain and voice change.   Eyes: Negative for pain, discharge, redness and visual disturbance.  Respiratory: Positive for cough, shortness of breath and wheezing.   Cardiovascular: Negative for chest pain, palpitations and leg swelling.  Gastrointestinal: Positive for abdominal pain, nausea and vomiting.  Musculoskeletal: Negative for gait problem.  Skin: Negative for rash.  Neurological: Positive for headaches. Negative for dizziness, weakness, light-headedness and numbness.  All other systems reviewed and are negative.   Per HPI unless specifically indicated above  Social History   Social History  . Marital status: Single    Spouse  name: N/A  . Number of children: N/A  . Years of education: N/A   Occupational History  . Not on file.   Social History Main Topics  . Smoking status: Current Every Day Smoker    Packs/day: 2.00    Years: 35.00    Types: Cigarettes  . Smokeless tobacco: Never Used  . Alcohol use 3.6 oz/week    6 Cans of beer per week     Comment: 8 pack per week beer  . Drug use: No  . Sexual activity: Yes    Birth control/ protection: Condom   Other Topics Concern  . Not on file   Social History Narrative  . No  narrative on file    Past Surgical History:  Procedure Laterality Date  . ESOPHAGUS SURGERY      Family History  Problem Relation Age of Onset  . Hypertension Mother   . Hypertension Father   . Cancer Father     skin  . Thyroid disease Sister   . Cancer Maternal Grandmother   . Stroke Paternal Grandmother     Allergies as of 08/23/2016   No Known Allergies     Medication List       Accurate as of 08/23/16  4:32 PM. Always use your most recent med list.          albuterol 108 (90 Base) MCG/ACT inhaler Commonly known as:  PROVENTIL HFA;VENTOLIN HFA Inhale 2 puffs into the lungs every 6 (six) hours as needed for wheezing or shortness of breath.   azithromycin 250 MG tablet Commonly known as:  ZITHROMAX Take 2 the first day and then one each day after.   HYDROcodone-homatropine 5-1.5 MG/5ML syrup Commonly known as:  HYCODAN Take 5 mLs by mouth every 6 (six) hours as needed for cough.   omeprazole 20 MG capsule Commonly known as:  PRILOSEC Take 1 capsule (20 mg total) by mouth daily.   predniSONE 20 MG tablet Commonly known as:  DELTASONE 2 po at same time daily for 5 days   varenicline 0.5 MG X 11 & 1 MG X 42 tablet Commonly known as:  CHANTIX STARTING MONTH PAK Take one 0.5 mg tablet by mouth daily for 3 days, then one 0.5 mg tablet twice daily for 4 days, then one 1 mg tablet twice daily.   varenicline 1 MG tablet Commonly known as:  CHANTIX CONTINUING MONTH PAK Take 1 tablet (1 mg total) by mouth 2 (two) times daily.          Objective:    BP (!) 157/96   Pulse 100   Temp 98 F (36.7 C) (Oral)   Ht 6' (1.829 m)   Wt 174 lb (78.9 kg)   BMI 23.60 kg/m   Wt Readings from Last 3 Encounters:  08/23/16 174 lb (78.9 kg)  01/31/15 171 lb 3.2 oz (77.7 kg)  01/24/15 177 lb 3.2 oz (80.4 kg)    Physical Exam  Constitutional: He is oriented to person, place, and time. He appears well-developed and well-nourished. No distress.  HENT:  Right Ear:  Tympanic membrane, external ear and ear canal normal.  Left Ear: Tympanic membrane, external ear and ear canal normal.  Nose: Mucosal edema and rhinorrhea present. No sinus tenderness. No epistaxis. Right sinus exhibits maxillary sinus tenderness. Right sinus exhibits no frontal sinus tenderness. Left sinus exhibits maxillary sinus tenderness. Left sinus exhibits no frontal sinus tenderness.  Mouth/Throat: Uvula is midline and mucous membranes are normal. Posterior oropharyngeal edema and  posterior oropharyngeal erythema present. No oropharyngeal exudate or tonsillar abscesses.  Eyes: Conjunctivae are normal. No scleral icterus.  Neck: Neck supple. No thyromegaly present.  Cardiovascular: Normal rate, regular rhythm, normal heart sounds and intact distal pulses.   No murmur heard. Pulmonary/Chest: Effort normal and breath sounds normal. No respiratory distress. He has no wheezes. He has no rales. He exhibits no tenderness.  Abdominal: Soft. Bowel sounds are normal. He exhibits no distension. There is no tenderness. There is no rebound and no guarding.  Musculoskeletal: Normal range of motion. He exhibits no edema or tenderness.  Lymphadenopathy:    He has no cervical adenopathy.  Neurological: He is alert and oriented to person, place, and time. Coordination normal.  Skin: Skin is warm and dry. No rash noted. He is not diaphoretic.  Psychiatric: He has a normal mood and affect. His behavior is normal.  Nursing note and vitals reviewed.     Assessment & Plan:   Problem List Items Addressed This Visit    None    Visit Diagnoses    Bronchitis    -  Primary   Relevant Medications   albuterol (PROVENTIL HFA;VENTOLIN HFA) 108 (90 Base) MCG/ACT inhaler   azithromycin (ZITHROMAX) 250 MG tablet   HYDROcodone-homatropine (HYCODAN) 5-1.5 MG/5ML syrup   predniSONE (DELTASONE) 20 MG tablet   Needs smoking cessation education       Relevant Medications   varenicline (CHANTIX STARTING MONTH PAK)  0.5 MG X 11 & 1 MG X 42 tablet   varenicline (CHANTIX CONTINUING MONTH PAK) 1 MG tablet   Essential hypertension       Relevant Orders   CMP14+EGFR   Gastroesophageal reflux disease without esophagitis       Patient has globus sensation, will try omeprazole daily and if not working will send 2 gastroenterology   Relevant Medications   omeprazole (PRILOSEC) 20 MG capsule       Follow up plan: Return in about 4 weeks (around 09/20/2016), or if symptoms worsen or fail to improve, for Fasting labs and physical exam.  Caryl Pina, MD [facility] 08/23/2016, 4:32 PM

## 2016-08-24 LAB — CMP14+EGFR
ALT: 25 IU/L (ref 0–44)
AST: 28 IU/L (ref 0–40)
Albumin/Globulin Ratio: 1.6 (ref 1.2–2.2)
Albumin: 4.4 g/dL (ref 3.5–5.5)
Alkaline Phosphatase: 66 IU/L (ref 39–117)
BUN/Creatinine Ratio: 10 (ref 9–20)
BUN: 9 mg/dL (ref 6–24)
Bilirubin Total: 0.3 mg/dL (ref 0.0–1.2)
CO2: 21 mmol/L (ref 18–29)
Calcium: 9.5 mg/dL (ref 8.7–10.2)
Chloride: 100 mmol/L (ref 96–106)
Creatinine, Ser: 0.94 mg/dL (ref 0.76–1.27)
GFR calc Af Amer: 108 mL/min/{1.73_m2} (ref >59)
GFR calc non Af Amer: 93 mL/min/{1.73_m2} (ref >59)
Globulin, Total: 2.8 g/dL (ref 1.5–4.5)
Glucose: 90 mg/dL (ref 65–99)
Potassium: 3.9 mmol/L (ref 3.5–5.2)
SODIUM: 139 mmol/L (ref 134–144)
Total Protein: 7.2 g/dL (ref 6.0–8.5)

## 2016-08-24 MED ORDER — HYDROCHLOROTHIAZIDE 25 MG PO TABS: 25.0000 mg | ORAL_TABLET | Freq: Every day | ORAL | 1 refills | Status: DC

## 2016-08-24 NOTE — Addendum Note (Signed)
Addended by: Arville Care on: 08/24/2016 08:16 AM   Modules accepted: Orders

## 2016-09-20 ENCOUNTER — Encounter: Payer: Self-pay | Admitting: Family Medicine

## 2016-09-20 ENCOUNTER — Ambulatory Visit (INDEPENDENT_AMBULATORY_CARE_PROVIDER_SITE_OTHER): Payer: BLUE CROSS/BLUE SHIELD | Admitting: Family Medicine

## 2016-09-20 VITALS — BP 125/87 | HR 93 | Temp 98.9°F | Ht 72.0 in | Wt 166.0 lb

## 2016-09-20 DIAGNOSIS — Z1211 Encounter for screening for malignant neoplasm of colon: Secondary | ICD-10-CM

## 2016-09-20 DIAGNOSIS — I10 Essential (primary) hypertension: Secondary | ICD-10-CM | POA: Insufficient documentation

## 2016-09-20 DIAGNOSIS — Z0000 Encounter for general adult medical examination without abnormal findings: Secondary | ICD-10-CM

## 2016-09-20 LAB — CMP14+EGFR
ALT: 24 IU/L (ref 0–44)
AST: 20 IU/L (ref 0–40)
Albumin/Globulin Ratio: 1.5 (ref 1.2–2.2)
Albumin: 4.3 g/dL (ref 3.5–5.5)
Alkaline Phosphatase: 67 IU/L (ref 39–117)
BUN/Creatinine Ratio: 11 (ref 9–20)
BUN: 11 mg/dL (ref 6–24)
Bilirubin Total: 0.6 mg/dL (ref 0.0–1.2)
CO2: 28 mmol/L (ref 18–29)
Calcium: 9.6 mg/dL (ref 8.7–10.2)
Chloride: 95 mmol/L — ABNORMAL LOW (ref 96–106)
Creatinine, Ser: 0.99 mg/dL (ref 0.76–1.27)
GFR calc Af Amer: 102 mL/min/{1.73_m2} (ref >59)
GFR calc non Af Amer: 88 mL/min/{1.73_m2} (ref >59)
Globulin, Total: 2.8 g/dL (ref 1.5–4.5)
Glucose: 99 mg/dL (ref 65–99)
Potassium: 4.2 mmol/L (ref 3.5–5.2)
Sodium: 138 mmol/L (ref 134–144)
Total Protein: 7.1 g/dL (ref 6.0–8.5)

## 2016-09-20 LAB — LIPID PANEL
CHOL/HDL RATIO: 4.3 ratio (ref 0.0–5.0)
CHOLESTEROL TOTAL: 260 mg/dL — AB (ref 100–199)
HDL: 60 mg/dL (ref >39)
LDL CALC: 173 mg/dL — AB (ref 0–99)
TRIGLYCERIDES: 133 mg/dL (ref 0–149)
VLDL Cholesterol Cal: 27 mg/dL (ref 5–40)

## 2016-09-20 NOTE — Progress Notes (Signed)
BP 125/87   Pulse 93   Temp 98.9 F (37.2 C) (Oral)   Ht 6' (1.829 m)   Wt 166 lb (75.3 kg)   BMI 22.51 kg/m    Subjective:    Patient ID: Jeff Walker, male    DOB: 01-Mar-1966, 51 y.o.   MRN: [id number]  HPI: Jeff Walker is a 51 y.o. male presenting on 09/20/2016 for Annual Exam (patient is fasting)   HPI Adult well exam Patient is coming in for an annual well exam. Patient has hypertension but is on pressure medication is been working well for him so far. His blood pressure today is 125/87. He takes hydrochlorothiazide for this. Patient denies any chest pain, shortness of breath, headaches or vision issues, abdominal complaints, diarrhea, nausea, vomiting, or joint issues.   Relevant past medical, surgical, family and social history reviewed and updated as indicated. Interim medical history since our last visit reviewed. Allergies and medications reviewed and updated.  Review of Systems  Constitutional: Negative for chills and fever.  HENT: Negative for ear pain and tinnitus.   Eyes: Negative for pain and discharge.  Respiratory: Negative for cough, shortness of breath and wheezing.   Cardiovascular: Negative for chest pain, palpitations and leg swelling.  Gastrointestinal: Negative for abdominal pain, blood in stool, constipation and diarrhea.  Genitourinary: Negative for dysuria and hematuria.  Musculoskeletal: Negative for back pain, gait problem and myalgias.  Skin: Negative for rash.  Neurological: Negative for dizziness, weakness and headaches.  Psychiatric/Behavioral: Negative for suicidal ideas.  All other systems reviewed and are negative.   Per HPI unless specifically indicated above   Allergies as of 09/20/2016   No Known Allergies     Medication List       Accurate as of 09/20/16 10:51 AM. Always use your most recent med list.          hydrochlorothiazide 25 MG tablet Commonly known as:  HYDRODIURIL Take 1 tablet (25 mg total) by mouth  daily.   varenicline 0.5 MG X 11 & 1 MG X 42 tablet Commonly known as:  CHANTIX STARTING MONTH PAK Take one 0.5 mg tablet by mouth daily for 3 days, then one 0.5 mg tablet twice daily for 4 days, then one 1 mg tablet twice daily.   varenicline 1 MG tablet Commonly known as:  CHANTIX CONTINUING MONTH PAK Take 1 tablet (1 mg total) by mouth 2 (two) times daily.          Objective:    BP 125/87   Pulse 93   Temp 98.9 F (37.2 C) (Oral)   Ht 6' (1.829 m)   Wt 166 lb (75.3 kg)   BMI 22.51 kg/m   Wt Readings from Last 3 Encounters:  09/20/16 166 lb (75.3 kg)  08/23/16 174 lb (78.9 kg)  01/31/15 171 lb 3.2 oz (77.7 kg)    Physical Exam  Constitutional: He is oriented to person, place, and time. He appears well-developed and well-nourished. No distress.  HENT:  Right Ear: External ear normal.  Left Ear: External ear normal.  Nose: Nose normal.  Mouth/Throat: Oropharynx is clear and moist. No oropharyngeal exudate.  Eyes: Conjunctivae and EOM are normal. Pupils are equal, round, and reactive to light. Right eye exhibits no discharge. No scleral icterus.  Neck: Neck supple. No thyromegaly present.  Cardiovascular: Normal rate, regular rhythm, normal heart sounds and intact distal pulses.   No murmur heard. Pulmonary/Chest: Effort normal and breath sounds normal. No respiratory distress. He  has no wheezes.  Abdominal: Soft. Bowel sounds are normal. He exhibits no distension. There is no tenderness. There is no rebound and no guarding.  Musculoskeletal: Normal range of motion. He exhibits no edema.  Lymphadenopathy:    He has no cervical adenopathy.  Neurological: He is alert and oriented to person, place, and time. Coordination normal.  Skin: Skin is warm and dry. No rash noted. He is not diaphoretic.  Psychiatric: He has a normal mood and affect. His behavior is normal.  Nursing note and vitals reviewed.     Assessment & Plan:   Problem List Items Addressed This Visit        Cardiovascular and Mediastinum   Hypertension   Relevant Orders   CMP14+EGFR   Lipid panel    Other Visit Diagnoses    Well adult exam    -  Primary   Relevant Orders   CMP14+EGFR   Lipid panel   Colon cancer screening       Relevant Orders   Ambulatory referral to Gastroenterology      Follow up plan: Return in about 1 year (around 09/20/2017), or if symptoms worsen or fail to improve.  Counseling provided for all of the vaccine components No orders of the defined types were placed in this encounter.   Caryl Pina, MD [facility] 09/20/2016, 10:51 AM

## 2016-09-21 MED ORDER — ATORVASTATIN CALCIUM 40 MG PO TABS: 40.0000 mg | ORAL_TABLET | Freq: Every day | ORAL | 1 refills | Status: DC

## 2016-09-21 NOTE — Addendum Note (Signed)
Addended by: Angela AdamOSTOSKY, Lichelle Viets C on: 09/21/2016 02:53 PM   Modules accepted: Orders

## 2016-10-30 ENCOUNTER — Other Ambulatory Visit: Payer: Self-pay | Admitting: Family Medicine

## 2017-06-07 ENCOUNTER — Encounter: Payer: Self-pay | Admitting: Family Medicine

## 2017-06-07 ENCOUNTER — Ambulatory Visit (INDEPENDENT_AMBULATORY_CARE_PROVIDER_SITE_OTHER): Payer: BLUE CROSS/BLUE SHIELD

## 2017-06-07 ENCOUNTER — Ambulatory Visit: Payer: BLUE CROSS/BLUE SHIELD | Admitting: Family Medicine

## 2017-06-07 VITALS — BP 130/82 | HR 108 | Temp 97.2°F | Ht 72.0 in | Wt 162.0 lb

## 2017-06-07 DIAGNOSIS — R634 Abnormal weight loss: Secondary | ICD-10-CM

## 2017-06-07 DIAGNOSIS — R05 Cough: Secondary | ICD-10-CM | POA: Diagnosis not present

## 2017-06-07 DIAGNOSIS — J441 Chronic obstructive pulmonary disease with (acute) exacerbation: Secondary | ICD-10-CM

## 2017-06-07 LAB — VERITOR FLU A/B WAIVED
INFLUENZA A: NEGATIVE
Influenza B: NEGATIVE

## 2017-06-07 MED ORDER — ALBUTEROL SULFATE HFA 108 (90 BASE) MCG/ACT IN AERS: 2.0000 | INHALATION_SPRAY | Freq: Four times a day (QID) | RESPIRATORY_TRACT | 0 refills | Status: DC | PRN

## 2017-06-07 MED ORDER — PREDNISONE 20 MG PO TABS: ORAL_TABLET | ORAL | 0 refills | Status: DC

## 2017-06-07 MED ORDER — DOXYCYCLINE HYCLATE 100 MG PO TABS: 100.0000 mg | ORAL_TABLET | Freq: Two times a day (BID) | ORAL | 0 refills | Status: DC

## 2017-06-07 MED ORDER — HYDROCODONE-HOMATROPINE 5-1.5 MG/5ML PO SYRP: 5.0000 mL | ORAL_SOLUTION | Freq: Four times a day (QID) | ORAL | 0 refills | Status: DC | PRN

## 2017-06-07 NOTE — Progress Notes (Signed)
BP 130/82   Pulse (!) 108   Temp (!) 97.2 F (36.2 C) (Oral)   Ht 6' (1.829 m)   Wt 162 lb (73.5 kg)   BMI 21.97 kg/m    Subjective:    Patient ID: Jeff Walker, male    DOB: Dec 30, 1965, 52 y.o.   MRN: [id number]  HPI: Jeff Walker is a 52 y.o. male presenting on 06/07/2017 for Sinusitis ( sinus pressure and congestion; symptoms ongoing for about 2 weeks, but started feeling worse 2 days ago); Cough (severe, worse at night); and Bronchitis (chest congestion)   HPI Sinus pressure and chest congestion Patient has been having sinus pressure and chest congestion.  He says he has been having the symptoms for about 2 weeks but it really started getting worse 2 days ago.  His cough has been more severe and worse at night keeping him up at night.  He says is been coughing so much that the left lower side of his ribs is starting to hurt.  He denies any fevers or chills but he does admit to having some wheezing and chest congestion.  He does have a significant smoking history and is still currently smoking 1 pack/day.  He does admit that he has been told that he probably has COPD before and he has used an inhaler when he has been ill previously.  He is not ready to quit smoking.  He says he is lost 45 pounds over the last couple years and he is having trouble gaining weight.  Relevant past medical, surgical, family and social history reviewed and updated as indicated. Interim medical history since our last visit reviewed. Allergies and medications reviewed and updated.  Review of Systems  Constitutional: Negative for chills and fever.  HENT: Positive for congestion, postnasal drip, rhinorrhea, sinus pressure and sore throat. Negative for ear discharge, ear pain, sneezing and voice change.   Eyes: Negative for pain, discharge, redness and visual disturbance.  Respiratory: Positive for cough, chest tightness and wheezing. Negative for shortness of breath.   Cardiovascular: Negative for chest  pain and leg swelling.  Musculoskeletal: Negative for gait problem.  Skin: Negative for rash.  All other systems reviewed and are negative.   Per HPI unless specifically indicated above        Objective:    BP 130/82   Pulse (!) 108   Temp (!) 97.2 F (36.2 C) (Oral)   Ht 6' (1.829 m)   Wt 162 lb (73.5 kg)   BMI 21.97 kg/m   Wt Readings from Last 3 Encounters:  06/07/17 162 lb (73.5 kg)  09/20/16 166 lb (75.3 kg)  08/23/16 174 lb (78.9 kg)    Physical Exam  Constitutional: He is oriented to person, place, and time. He appears well-developed and well-nourished. No distress.  HENT:  Right Ear: Tympanic membrane, external ear and ear canal normal.  Left Ear: Tympanic membrane, external ear and ear canal normal.  Nose: Mucosal edema and rhinorrhea present. No sinus tenderness. No epistaxis. Right sinus exhibits no maxillary sinus tenderness and no frontal sinus tenderness. Left sinus exhibits no maxillary sinus tenderness and no frontal sinus tenderness.  Mouth/Throat: Uvula is midline and mucous membranes are normal. Posterior oropharyngeal edema and posterior oropharyngeal erythema present. No oropharyngeal exudate or tonsillar abscesses.  Eyes: Conjunctivae are normal. No scleral icterus.  Neck: Neck supple. No thyromegaly present.  Cardiovascular: Normal rate, regular rhythm, normal heart sounds and intact distal pulses.  No murmur heard. Pulmonary/Chest: Effort  normal. No respiratory distress. He has wheezes. He has no rales. He exhibits no tenderness.  Musculoskeletal: Normal range of motion. He exhibits no edema.  Lymphadenopathy:    He has no cervical adenopathy.  Neurological: He is alert and oriented to person, place, and time. Coordination normal.  Skin: Skin is warm and dry. No rash noted. He is not diaphoretic.  Psychiatric: He has a normal mood and affect. His behavior is normal.  Nursing note and vitals reviewed.   Chest x-ray: No acute cardiopulmonary  abnormality noted, shows improvement from previous chest x-ray, await final read from radiology    Assessment & Plan:   Problem List Items Addressed This Visit    None    Visit Diagnoses    COPD with acute exacerbation (HCC)    -  Primary   Relevant Medications   predniSONE (DELTASONE) 20 MG tablet   doxycycline (VIBRA-TABS) 100 MG tablet   albuterol (PROVENTIL HFA;VENTOLIN HFA) 108 (90 Base) MCG/ACT inhaler   HYDROcodone-homatropine (HYCODAN) 5-1.5 MG/5ML syrup   Other Relevant Orders   Veritor Flu A/B Waived   DG Chest 2 View   Weight loss       Relevant Orders   DG Chest 2 View       Follow up plan: Return in about 2 months (around 08/05/2017), or if symptoms worsen or fail to improve, for Screening labs and COPD.  Counseling provided for all of the vaccine components Orders Placed This Encounter  Procedures  . DG Chest 2 View  . Veritor Flu A/B Waived    Arville Care, MD [facility] 06/07/2017, 11:37 AM

## 2017-06-16 ENCOUNTER — Other Ambulatory Visit: Payer: Self-pay | Admitting: Family Medicine

## 2017-08-21 ENCOUNTER — Other Ambulatory Visit: Payer: Self-pay

## 2017-08-21 ENCOUNTER — Encounter: Payer: Self-pay | Admitting: Family Medicine

## 2017-08-21 ENCOUNTER — Ambulatory Visit: Payer: BLUE CROSS/BLUE SHIELD | Admitting: Family Medicine

## 2017-08-21 VITALS — BP 131/88 | HR 100 | Temp 97.5°F | Ht 72.0 in | Wt 159.4 lb

## 2017-08-21 DIAGNOSIS — R634 Abnormal weight loss: Secondary | ICD-10-CM | POA: Diagnosis not present

## 2017-08-21 DIAGNOSIS — F339 Major depressive disorder, recurrent, unspecified: Secondary | ICD-10-CM | POA: Insufficient documentation

## 2017-08-21 DIAGNOSIS — E441 Mild protein-calorie malnutrition: Secondary | ICD-10-CM | POA: Diagnosis not present

## 2017-08-21 DIAGNOSIS — R1319 Other dysphagia: Secondary | ICD-10-CM

## 2017-08-21 DIAGNOSIS — R1310 Dysphagia, unspecified: Secondary | ICD-10-CM

## 2017-08-21 DIAGNOSIS — Z716 Tobacco abuse counseling: Secondary | ICD-10-CM | POA: Diagnosis not present

## 2017-08-21 MED ORDER — VENLAFAXINE HCL ER 75 MG PO CP24: 75.0000 mg | ORAL_CAPSULE | Freq: Every day | ORAL | 2 refills | Status: DC

## 2017-08-21 MED ORDER — VENLAFAXINE HCL ER 37.5 MG PO CP24: 37.5000 mg | ORAL_CAPSULE | Freq: Every day | ORAL | 0 refills | Status: DC

## 2017-08-21 MED ORDER — TRAZODONE HCL 50 MG PO TABS
25.0000 mg | ORAL_TABLET | Freq: Every evening | ORAL | 3 refills | Status: DC | PRN
Start: 2017-08-21 — End: 2018-11-26

## 2017-08-21 MED ORDER — VARENICLINE TARTRATE 1 MG PO TABS: 1.0000 mg | ORAL_TABLET | Freq: Two times a day (BID) | ORAL | 1 refills | Status: DC

## 2017-08-21 MED ORDER — VARENICLINE TARTRATE 0.5 MG X 11 & 1 MG X 42 PO MISC: ORAL | 0 refills | Status: DC

## 2017-08-21 NOTE — Progress Notes (Signed)
BP 131/88   Pulse 100   Temp (!) 97.5 F (36.4 C) (Oral)   Ht 6' (1.829 m)   Wt 159 lb 6 oz (72.3 kg)   BMI 21.62 kg/m    Subjective:    Patient ID: Jeff JakschWilliam M Dalzell, male    DOB: Nov 14, 1965, 52 y.o.   MRN: [id number]  HPI: Jeff JakschWilliam M Halpin is a 52 y.o. male presenting on 08/21/2017 for Weight loss, difficulty swallowing (history of esphogeal surgery) and Insomnia, depressoin   HPI Difficulty swallowing and weight loss  Patient comes in complaining of difficulty swallowing and weight loss that has been going on progressively over the past few years but is worsened over the past 6 months.  He tried some medicine for acid reflux but not feel like it helped and his weight has been consistently coming down over the past year.  He is down now to 159 pounds and 1 year ago he weighed 174 pounds.  He says he is just not able to swallow and is now starting to have problems with liquids as well over the past few months.  He just feels like he does not have any energy because of his difficulty swallowing.  He feels like everything gets stuck in his throat  Smoking cessation Patient is coming in today for smoking cessation and has been on this works well for him and would like to try it again.  He is starting to have some wheezing and would like to be done with the cigarette smoking.  He does not know how much he is currently smoking but knows that more than a pack per day  Depression feeling down Patient has a thought depression off and on throughout his life and feels like it is significantly coming back to him now.  He said with his health issues he does not have energy but then he feels like he is sad all the time and is having difficulty sleeping and just not feeling like himself.  He denies any suicidal ideations or thoughts of hurting himself.  Relevant past medical, surgical, family and social history reviewed and updated as indicated. Interim medical history since our last visit  reviewed. Allergies and medications reviewed and updated.  Review of Systems  Constitutional: Positive for unexpected weight change. Negative for chills and fever.  HENT: Positive for trouble swallowing.   Eyes: Negative for discharge.  Respiratory: Negative for shortness of breath and wheezing.   Cardiovascular: Negative for chest pain and leg swelling.  Musculoskeletal: Negative for back pain and gait problem.  Skin: Negative for rash.  Neurological: Positive for weakness.  Psychiatric/Behavioral: Positive for decreased concentration, dysphoric mood and sleep disturbance. Negative for self-injury and suicidal ideas. The patient is nervous/anxious.   All other systems reviewed and are negative.   Per HPI unless specifically indicated above   Allergies as of 08/21/2017   No Known Allergies     Medication List        Accurate as of 08/21/17 12:07 PM. Always use your most recent med list.          albuterol 108 (90 Base) MCG/ACT inhaler Commonly known as:  PROVENTIL HFA;VENTOLIN HFA Inhale 2 puffs into the lungs every 6 (six) hours as needed for wheezing or shortness of breath.   atorvastatin 40 MG tablet Commonly known as:  LIPITOR TAKE 1 TABLET BY MOUTH ONCE DAILY   doxycycline 100 MG tablet Commonly known as:  VIBRA-TABS Take 1 tablet (100 mg total) by mouth  2 (two) times daily. 1 po bid   hydrochlorothiazide 25 MG tablet Commonly known as:  HYDRODIURIL TAKE 1 TABLET BY MOUTH ONCE DAILY   HYDROcodone-homatropine 5-1.5 MG/5ML syrup Commonly known as:  HYCODAN Take 5 mLs by mouth every 6 (six) hours as needed for cough.   predniSONE 20 MG tablet Commonly known as:  DELTASONE Take 3 tabs daily for 1 week, then 2 tabs daily for week 2, then 1 tab daily for week 3.   traZODone 50 MG tablet Commonly known as:  DESYREL Take 0.5-1 tablets (25-50 mg total) by mouth at bedtime as needed for sleep.   varenicline 0.5 MG X 11 & 1 MG X 42 tablet Commonly known as:   CHANTIX STARTING MONTH PAK Take one 0.5 mg tablet by mouth daily for 3 days, then one 0.5 mg tablet twice daily for 4 days, then one 1 mg tablet twice daily.   varenicline 1 MG tablet Commonly known as:  CHANTIX CONTINUING MONTH PAK Take 1 tablet (1 mg total) by mouth 2 (two) times daily.   venlafaxine XR 37.5 MG 24 hr capsule Commonly known as:  EFFEXOR XR Take 1 capsule (37.5 mg total) by mouth daily with breakfast.   venlafaxine XR 75 MG 24 hr capsule Commonly known as:  EFFEXOR XR Take 1 capsule (75 mg total) by mouth daily with breakfast.          Objective:    BP 131/88   Pulse 100   Temp (!) 97.5 F (36.4 C) (Oral)   Ht 6' (1.829 m)   Wt 159 lb 6 oz (72.3 kg)   BMI 21.62 kg/m   Wt Readings from Last 3 Encounters:  08/21/17 159 lb 6 oz (72.3 kg)  06/07/17 162 lb (73.5 kg)  09/20/16 166 lb (75.3 kg)    Physical Exam  Constitutional: He is oriented to person, place, and time. He appears cachectic. No distress.  Eyes: Conjunctivae are normal. No scleral icterus.  Cardiovascular: Normal rate, regular rhythm, normal heart sounds and intact distal pulses.  No murmur heard. Pulmonary/Chest: Effort normal and breath sounds normal. No respiratory distress. He has no wheezes.  Musculoskeletal: Normal range of motion. He exhibits no edema.  Neurological: He is alert and oriented to person, place, and time. Coordination normal.  Skin: Skin is warm and dry. No rash noted. He is not diaphoretic.  Psychiatric: His behavior is normal. Judgment normal. His mood appears anxious. He exhibits a depressed mood. He expresses no suicidal ideation. He expresses no suicidal plans.  Nursing note and vitals reviewed.       Assessment & Plan:   Problem List Items Addressed This Visit      Other   Depression, recurrent (HCC)   Relevant Medications   traZODone (DESYREL) 50 MG tablet   venlafaxine XR (EFFEXOR XR) 37.5 MG 24 hr capsule   venlafaxine XR (EFFEXOR XR) 75 MG 24 hr capsule     Other Visit Diagnoses    Esophageal dysphagia    -  Primary   Relevant Orders   Ambulatory referral to Gastroenterology   DG Esophagus   Weight loss       Relevant Orders   Ambulatory referral to Gastroenterology   DG Esophagus   Malnutrition of mild degree (HCC)       Relevant Orders   Ambulatory referral to Gastroenterology   DG Esophagus   Encounter for smoking cessation counseling       Relevant Medications   varenicline (CHANTIX STARTING  MONTH PAK) 0.5 MG X 11 & 1 MG X 42 tablet   varenicline (CHANTIX CONTINUING MONTH PAK) 1 MG tablet       Follow up plan: Return in about 1 month (around 09/18/2017), or if symptoms worsen or fail to improve, for Depression and weight recheck.  Counseling provided for all of the vaccine components Orders Placed This Encounter  Procedures  . DG Esophagus  . Ambulatory referral to Gastroenterology    Arville Care, MD [facility] 08/21/2017, 12:07 PM

## 2017-08-21 NOTE — Progress Notes (Unsigned)
modified

## 2017-08-22 DIAGNOSIS — Z029 Encounter for administrative examinations, unspecified: Secondary | ICD-10-CM

## 2017-08-26 ENCOUNTER — Encounter: Payer: Self-pay | Admitting: Internal Medicine

## 2017-08-26 ENCOUNTER — Telehealth: Payer: Self-pay | Admitting: Family Medicine

## 2017-08-26 NOTE — Telephone Encounter (Signed)
Called gastroenterology office and got an appointment scheduled for him to see Lewie LoronAnna Boone tomorrow at 11 AM at [facility].  [address] [place].  Called patient up and informed him of this.  He knows that he is supposed to be there 15 minutes early

## 2017-08-27 ENCOUNTER — Telehealth: Payer: Self-pay

## 2017-08-27 ENCOUNTER — Ambulatory Visit (INDEPENDENT_AMBULATORY_CARE_PROVIDER_SITE_OTHER): Payer: BLUE CROSS/BLUE SHIELD | Admitting: Gastroenterology

## 2017-08-27 ENCOUNTER — Encounter: Payer: Self-pay | Admitting: Gastroenterology

## 2017-08-27 VITALS — BP 145/90 | HR 108 | Temp 98.7°F | Ht 72.0 in | Wt 159.6 lb

## 2017-08-27 DIAGNOSIS — R1310 Dysphagia, unspecified: Secondary | ICD-10-CM | POA: Insufficient documentation

## 2017-08-27 NOTE — Telephone Encounter (Signed)
Pt doesn't have transportation for BPE today or tomorrow. Requested appt 08/29/17. BPE scheduled for 08/29/17 at 11:00am. Letter for work given to him.

## 2017-08-27 NOTE — Assessment & Plan Note (Signed)
52 year old male with what sounds to be a history of esophageal atresia, requiring multiple surgeries/procedures as infant up through age 52 [facility]. I have been unable to obtain any records through [facility]. 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 Dysphagia reported for one year, now worsening. He is tolerating very small amounts of soft foods, and liquids are beginning to be a problem. With his history, he is at high risk for esophagitis, strictures, Barrett's esophagus (moreso than average population), esophageal cancer. Last reported EGD was around age 52. No records today, and I doubt we would be able to obtain these as it was almost 40 years ago.  Discussed briefly with Dr. Jena Gaussourk. Needs BPE as soon as possible prior to any endoscopy. He ultimately may need stat referral to [facility] for dilation if dealing with stricture. Differentials also include malignancy, in light of weight loss, progressive dysphagia. Will pursue BPE and make further recommendations at that point.  He is to seek emergent medical attention if unable to tolerate liquids. In the interim, continue soft foods, sipping liquids, watch for signs/symptoms of dehydration.   I did discuss with him risks and benefits of EGD if this is performed here. Would need Propofol due to polypharmacy and alcohol use.   I have sent a message in epic to patient's primary care physician as FYI.

## 2017-08-27 NOTE — Progress Notes (Signed)
cc'ed to pcp °

## 2017-08-27 NOTE — Patient Instructions (Signed)
I have ordered an xray swallow. We need to get a lay of the land regarding the anatomy. We are doing this as soon as possible.  If you have any worsening of swallowing, unable to swallow liquids, go to the emergency room.  Further recommendations to follow!  It was a pleasure to see you today. I strive to create trusting relationships with patients to provide genuine, compassionate, and quality care. I value your feedback. If you receive a survey regarding your visit,  I greatly appreciate you taking time to fill this out.   Gelene MinkAnna W. Kengo Sturges, PhD, ANP-BC [facility]

## 2017-08-27 NOTE — Progress Notes (Signed)
Primary Care Physician:  Dettinger, Elige RadonJoshua A, MD Primary Gastroenterologist:  Dr. Jena Gaussourk   Chief Complaint  Patient presents with  . Dysphagia    trouble with solids/liquids  . Weight Loss    30lbs in 3 months    HPI:   Jeff Walker is a 52 y.o. male presenting today at the request of Dr. Louanne Skyeettinger due to worsening dysphagia and weight loss. Weight in Jan 2019 documented at 162, today 159. 2.5 years ago weighed 210.   States his esophagus was in two pieces when he was born. From reports sounds like esophageal atresia. He reports a chest tube as a teenager as well, and I wonder if he had trachoesophageal fistula.  Multiple surgeries, last at age 89. Last possible EGD around at age 52. Believes [facility]. He states he was told as a child that he may need dilations in the future.  Dysphagia for a year, worsening over the past 4 months. Notes discomfort right at xiphoid process, constant. Gets food down but has to chew very well. Sometimes after eating, will feel food lodged and coughs, then flies out of his mouth. Sips liquids but it's like "work" to do it. Sipping on liquids non-stop. Loves to cook and can't eat it. No abdominal pain. Intermittent nausea, then vomits and feels better. No issues with chronic GERD. Unsure when he had to take the last Tums. Rare NSAIDs.    No prior colonoscopy.    Past Medical History:  Diagnosis Date  . Depression   . Environmental allergies   . Hypertension   . Shingles     Past Surgical History:  Procedure Laterality Date  . ESOPHAGUS SURGERY     multiple as child, from his description sounds like esophageal atresia    Current Outpatient Medications  Medication Sig Dispense Refill  . atorvastatin (LIPITOR) 40 MG tablet TAKE 1 TABLET BY MOUTH ONCE DAILY 90 tablet 1  . hydrochlorothiazide (HYDRODIURIL) 25 MG tablet TAKE 1 TABLET BY MOUTH ONCE DAILY 90 tablet 1  . traZODone (DESYREL) 50 MG tablet Take 0.5-1 tablets (25-50 mg total) by  mouth at bedtime as needed for sleep. 30 tablet 3  . varenicline (CHANTIX STARTING MONTH PAK) 0.5 MG X 11 & 1 MG X 42 tablet Take one 0.5 mg tablet by mouth daily for 3 days, then one 0.5 mg tablet twice daily for 4 days, then one 1 mg tablet twice daily. 53 tablet 0  . venlafaxine XR (EFFEXOR XR) 37.5 MG 24 hr capsule Take 1 capsule (37.5 mg total) by mouth daily with breakfast. 7 capsule 0  . venlafaxine XR (EFFEXOR XR) 75 MG 24 hr capsule Take 1 capsule (75 mg total) by mouth daily with breakfast. 30 capsule 2   No current facility-administered medications for this visit.     Allergies as of 08/27/2017  . (No Known Allergies)    Family History  Problem Relation Age of Onset  . Hypertension Mother   . Hypertension Father   . Cancer Father        skin  . Thyroid disease Sister   . Cancer Maternal Grandmother   . Stroke Paternal Grandmother   . Colon cancer Neg Hx   . Colon polyps Neg Hx     Social History   Socioeconomic History  . Marital status: Single    Spouse name: Not on file  . Number of children: Not on file  . Years of education: Not on file  .  Highest education level: Not on file  Occupational History  . Not on file  Social Needs  . Financial resource strain: Not on file  . Food insecurity:    Worry: Not on file    Inability: Not on file  . Transportation needs:    Medical: Not on file    Non-medical: Not on file  Tobacco Use  . Smoking status: Current Every Day Smoker    Packs/day: 1.00    Years: 35.00    Pack years: 35.00    Types: Cigarettes  . Smokeless tobacco: Never Used  Substance and Sexual Activity  . Alcohol use: Yes    Alcohol/week: 3.6 oz    Types: 6 Cans of beer per week    Comment: 1-2 beers per day  (makes his own beer)   . Drug use: No  . Sexual activity: Yes    Birth control/protection: Condom  Lifestyle  . Physical activity:    Days per week: Not on file    Minutes per session: Not on file  . Stress: Not on file    Relationships  . Social connections:    Talks on phone: Not on file    Gets together: Not on file    Attends religious service: Not on file    Active member of club or organization: Not on file    Attends meetings of clubs or organizations: Not on file    Relationship status: Not on file  . Intimate partner violence:    Fear of current or ex partner: Not on file    Emotionally abused: Not on file    Physically abused: Not on file    Forced sexual activity: Not on file  Other Topics Concern  . Not on file  Social History Narrative  . Not on file    Review of Systems: Gen: see HPI  CV: Denies chest pain, heart palpitations, peripheral edema, syncope.  Resp: Denies shortness of breath at rest or with exertion. Denies wheezing or cough.  GI: see HPI  GU : Denies urinary burning, urinary frequency, urinary hesitancy MS: Denies joint pain, muscle weakness, cramps, or limitation of movement.  Derm: Denies rash, itching, dry skin Psych: Denies depression, anxiety, memory loss, and confusion Heme: Denies bruising, bleeding, and enlarged lymph nodes.  Physical Exam: BP (!) 145/90   Pulse (!) 108   Temp 98.7 F (37.1 C) (Oral)   Ht 6' (1.829 m)   Wt 159 lb 9.6 oz (72.4 kg)   BMI 21.65 kg/m  General:   Alert and oriented. Pleasant and cooperative. Well-nourished and well-developed.  Head:  Normocephalic and atraumatic. Eyes:  Without icterus, sclera clear and conjunctiva pink.  Ears:  Normal auditory acuity. Nose:  No deformity, discharge,  or lesions. Mouth:  No deformity or lesions, oral mucosa pink.  Lungs:  Mild expiratory wheeze posteriorly, no crackles Heart:  S1, S2 present without murmurs appreciated.  Abdomen:  +BS, soft, non-tender and non-distended. No HSM noted. No guarding or rebound. No masses appreciated.  Rectal:  Deferred  Msk:  Symmetrical without gross deformities. Normal posture. Extremities:  Without  edema. Neurologic:  Alert and  oriented x4 Psych:   Alert and cooperative. Normal mood and affect.

## 2017-08-27 NOTE — Telephone Encounter (Signed)
Noted  

## 2017-08-29 ENCOUNTER — Ambulatory Visit (HOSPITAL_COMMUNITY)
Admission: RE | Admit: 2017-08-29 | Discharge: 2017-08-29 | Disposition: A | Discharge status: Home / Self Care | Payer: BLUE CROSS/BLUE SHIELD | Source: Ambulatory Visit | Attending: Gastroenterology | Admitting: Gastroenterology

## 2017-08-29 DIAGNOSIS — R1310 Dysphagia, unspecified: Secondary | ICD-10-CM | POA: Diagnosis not present

## 2017-08-30 DIAGNOSIS — I10 Essential (primary) hypertension: Secondary | ICD-10-CM | POA: Diagnosis not present

## 2017-08-30 DIAGNOSIS — R51 Headache: Secondary | ICD-10-CM | POA: Diagnosis not present

## 2017-08-30 DIAGNOSIS — K222 Esophageal obstruction: Secondary | ICD-10-CM | POA: Diagnosis not present

## 2017-08-30 DIAGNOSIS — R079 Chest pain, unspecified: Secondary | ICD-10-CM | POA: Diagnosis not present

## 2017-08-30 DIAGNOSIS — R1013 Epigastric pain: Secondary | ICD-10-CM | POA: Diagnosis not present

## 2017-08-30 DIAGNOSIS — R1110 Vomiting, unspecified: Secondary | ICD-10-CM | POA: Diagnosis not present

## 2017-08-30 DIAGNOSIS — K219 Gastro-esophageal reflux disease without esophagitis: Secondary | ICD-10-CM | POA: Diagnosis not present

## 2017-08-30 DIAGNOSIS — I951 Orthostatic hypotension: Secondary | ICD-10-CM | POA: Diagnosis not present

## 2017-08-30 DIAGNOSIS — Z9889 Other specified postprocedural states: Secondary | ICD-10-CM | POA: Diagnosis not present

## 2017-08-30 DIAGNOSIS — R262 Difficulty in walking, not elsewhere classified: Secondary | ICD-10-CM | POA: Diagnosis not present

## 2017-08-30 DIAGNOSIS — J9811 Atelectasis: Secondary | ICD-10-CM | POA: Diagnosis not present

## 2017-08-30 DIAGNOSIS — E860 Dehydration: Secondary | ICD-10-CM | POA: Diagnosis not present

## 2017-08-30 DIAGNOSIS — I959 Hypotension, unspecified: Secondary | ICD-10-CM | POA: Diagnosis not present

## 2017-08-30 DIAGNOSIS — D72829 Elevated white blood cell count, unspecified: Secondary | ICD-10-CM | POA: Diagnosis not present

## 2017-08-30 DIAGNOSIS — F17200 Nicotine dependence, unspecified, uncomplicated: Secondary | ICD-10-CM | POA: Diagnosis not present

## 2017-08-30 DIAGNOSIS — E7800 Pure hypercholesterolemia, unspecified: Secondary | ICD-10-CM | POA: Diagnosis not present

## 2017-08-30 DIAGNOSIS — Z79899 Other long term (current) drug therapy: Secondary | ICD-10-CM | POA: Diagnosis not present

## 2017-08-30 DIAGNOSIS — E876 Hypokalemia: Secondary | ICD-10-CM | POA: Diagnosis not present

## 2017-08-30 DIAGNOSIS — E871 Hypo-osmolality and hyponatremia: Secondary | ICD-10-CM | POA: Diagnosis not present

## 2017-08-31 DIAGNOSIS — D72829 Elevated white blood cell count, unspecified: Secondary | ICD-10-CM | POA: Diagnosis not present

## 2017-08-31 DIAGNOSIS — J9811 Atelectasis: Secondary | ICD-10-CM | POA: Diagnosis not present

## 2017-08-31 DIAGNOSIS — R1013 Epigastric pain: Secondary | ICD-10-CM | POA: Diagnosis not present

## 2017-08-31 DIAGNOSIS — K222 Esophageal obstruction: Secondary | ICD-10-CM | POA: Diagnosis not present

## 2017-08-31 DIAGNOSIS — I959 Hypotension, unspecified: Secondary | ICD-10-CM | POA: Diagnosis not present

## 2017-09-02 ENCOUNTER — Other Ambulatory Visit: Payer: Self-pay | Admitting: *Deleted

## 2017-09-02 DIAGNOSIS — R1310 Dysphagia, unspecified: Secondary | ICD-10-CM

## 2017-09-03 NOTE — Progress Notes (Signed)
Noted. Let's make sure he is sticking with liquids, full liquids, soft diet. If he is unable to tolerate diet, needs to seek medical attention.

## 2017-09-10 DIAGNOSIS — Z720 Tobacco use: Secondary | ICD-10-CM | POA: Insufficient documentation

## 2017-09-10 DIAGNOSIS — F419 Anxiety disorder, unspecified: Secondary | ICD-10-CM | POA: Insufficient documentation

## 2017-09-13 DIAGNOSIS — B3781 Candidal esophagitis: Secondary | ICD-10-CM | POA: Diagnosis not present

## 2017-09-13 DIAGNOSIS — K222 Esophageal obstruction: Secondary | ICD-10-CM | POA: Diagnosis not present

## 2017-09-13 DIAGNOSIS — F419 Anxiety disorder, unspecified: Secondary | ICD-10-CM | POA: Diagnosis not present

## 2017-09-13 DIAGNOSIS — R634 Abnormal weight loss: Secondary | ICD-10-CM | POA: Diagnosis not present

## 2017-09-13 DIAGNOSIS — F17210 Nicotine dependence, cigarettes, uncomplicated: Secondary | ICD-10-CM | POA: Diagnosis not present

## 2017-09-13 DIAGNOSIS — J3802 Paralysis of vocal cords and larynx, bilateral: Secondary | ICD-10-CM | POA: Diagnosis not present

## 2017-09-13 DIAGNOSIS — R1310 Dysphagia, unspecified: Secondary | ICD-10-CM | POA: Diagnosis not present

## 2017-09-13 DIAGNOSIS — I10 Essential (primary) hypertension: Secondary | ICD-10-CM | POA: Diagnosis not present

## 2017-09-13 DIAGNOSIS — R499 Unspecified voice and resonance disorder: Secondary | ICD-10-CM | POA: Diagnosis not present

## 2017-09-13 DIAGNOSIS — K210 Gastro-esophageal reflux disease with esophagitis: Secondary | ICD-10-CM | POA: Diagnosis not present

## 2017-10-12 DIAGNOSIS — R52 Pain, unspecified: Secondary | ICD-10-CM | POA: Diagnosis not present

## 2017-10-12 DIAGNOSIS — R402441 Other coma, without documented Glasgow coma scale score, or with partial score reported, in the field [EMT or ambulance]: Secondary | ICD-10-CM | POA: Diagnosis not present

## 2017-10-12 DIAGNOSIS — R0689 Other abnormalities of breathing: Secondary | ICD-10-CM | POA: Diagnosis not present

## 2017-10-12 DIAGNOSIS — T50904A Poisoning by unspecified drugs, medicaments and biological substances, undetermined, initial encounter: Secondary | ICD-10-CM | POA: Diagnosis not present

## 2017-11-08 ENCOUNTER — Ambulatory Visit: Payer: Self-pay | Admitting: Gastroenterology

## 2018-07-05 DIAGNOSIS — T50904A Poisoning by unspecified drugs, medicaments and biological substances, undetermined, initial encounter: Secondary | ICD-10-CM | POA: Diagnosis not present

## 2018-07-05 DIAGNOSIS — E1165 Type 2 diabetes mellitus with hyperglycemia: Secondary | ICD-10-CM | POA: Diagnosis not present

## 2018-07-05 DIAGNOSIS — R404 Transient alteration of awareness: Secondary | ICD-10-CM | POA: Diagnosis not present

## 2018-07-05 DIAGNOSIS — R092 Respiratory arrest: Secondary | ICD-10-CM | POA: Diagnosis not present

## 2018-11-26 ENCOUNTER — Encounter: Payer: Self-pay | Admitting: Family Medicine

## 2018-11-26 ENCOUNTER — Other Ambulatory Visit: Payer: Self-pay

## 2018-11-26 ENCOUNTER — Ambulatory Visit (INDEPENDENT_AMBULATORY_CARE_PROVIDER_SITE_OTHER): Payer: BC Managed Care – PPO | Admitting: Family Medicine

## 2018-11-26 DIAGNOSIS — J441 Chronic obstructive pulmonary disease with (acute) exacerbation: Secondary | ICD-10-CM | POA: Diagnosis not present

## 2018-11-26 MED ORDER — PREDNISONE 20 MG PO TABS: ORAL_TABLET | ORAL | 0 refills | Status: DC

## 2018-11-26 MED ORDER — AZITHROMYCIN 250 MG PO TABS: ORAL_TABLET | ORAL | 0 refills | Status: DC

## 2018-11-26 NOTE — Progress Notes (Signed)
Virtual Visit via telephone Note  I connected with Jeff Walker on 11/26/18 at 1452 by telephone and verified that I am speaking with the correct person using two identifiers. Jeff JakschWilliam M Dauenhauer is currently located at home and no other people are currently with her during visit. The provider, Elige RadonJoshua A Sahily Biddle, MD is located in their office at time of visit.  Call ended at 1507  I discussed the limitations, risks, security and privacy concerns of performing an evaluation and management service by telephone and the availability of in person appointments. I also discussed with the patient that there may be a patient responsible charge related to this service. The patient expressed understanding and agreed to proceed.   History and Present Illness: Patient is calling in with cough and congestion and fevers and chills and headache that has been going on for 2 days.  He had nausea 2 days ago.  He has also had sweats.  He is congested and fatigued.  He has decreased appetite and cough.  He is coughing up phlegm.  He denies shortness of breath.  He denies any wheezing.  He does smoke 1ppd for 40 years.   1. COPD with acute exacerbation [facility](HCC)     Outpatient Encounter Medications as of 11/26/2018  Medication Sig  . azithromycin (ZITHROMAX) 250 MG tablet Take 2 the first day and then one each day after.  . predniSONE (DELTASONE) 20 MG tablet 2 po at same time daily for 5 days  . [DISCONTINUED] atorvastatin (LIPITOR) 40 MG tablet TAKE 1 TABLET BY MOUTH ONCE DAILY  . [DISCONTINUED] hydrochlorothiazide (HYDRODIURIL) 25 MG tablet TAKE 1 TABLET BY MOUTH ONCE DAILY  . [DISCONTINUED] traZODone (DESYREL) 50 MG tablet Take 0.5-1 tablets (25-50 mg total) by mouth at bedtime as needed for sleep.  . [DISCONTINUED] varenicline (CHANTIX STARTING MONTH PAK) 0.5 MG X 11 & 1 MG X 42 tablet Take one 0.5 mg tablet by mouth daily for 3 days, then one 0.5 mg tablet twice daily for 4 days, then one 1 mg tablet twice  daily.  . [DISCONTINUED] venlafaxine XR (EFFEXOR XR) 37.5 MG 24 hr capsule Take 1 capsule (37.5 mg total) by mouth daily with breakfast.  . [DISCONTINUED] venlafaxine XR (EFFEXOR XR) 75 MG 24 hr capsule Take 1 capsule (75 mg total) by mouth daily with breakfast.   No facility-administered encounter medications on file as of 11/26/2018.     Review of Systems  Constitutional: Positive for fever. Negative for chills.  HENT: Positive for congestion, postnasal drip, rhinorrhea, sinus pressure, sneezing and sore throat. Negative for ear discharge, ear pain and voice change.   Eyes: Negative for pain, discharge, redness and visual disturbance.  Respiratory: Positive for cough and wheezing. Negative for chest tightness and shortness of breath.   Cardiovascular: Negative for chest pain and leg swelling.  Gastrointestinal: Negative for abdominal pain, constipation and diarrhea.  Musculoskeletal: Positive for myalgias. Negative for back pain and gait problem.  Skin: Negative for rash.  Neurological: Negative for syncope, light-headedness and headaches.  All other systems reviewed and are negative.   Observations/Objective: Patient sounds comfortable and in no acute distress  Assessment and Plan: Problem List Items Addressed This Visit    None    Visit Diagnoses    COPD with acute exacerbation (HCC)    -  Primary   Relevant Medications   azithromycin (ZITHROMAX) 250 MG tablet   predniSONE (DELTASONE) 20 MG tablet   Other Relevant Orders   Novel Coronavirus, NAA (  [facility])  [place] testing site only   MYCHART COVID-19 HOME MONITORING PROGRAM   Temperature monitoring       Follow Up Instructions: Follow up as needed and quarantine for 2 weeks and go for coronavirus testing   Will treat like COPD I discussed the assessment and treatment plan with the patient. The patient was provided an opportunity to ask questions and all were answered. The patient agreed with the plan and demonstrated an  understanding of the instructions.   The patient was advised to call back or seek an in-person evaluation if the symptoms worsen or if the condition fails to improve as anticipated.  The above assessment and management plan was discussed with the patient. The patient verbalized understanding of and has agreed to the management plan. Patient is aware to call the clinic if symptoms persist or worsen. Patient is aware when to return to the clinic for a follow-up visit. Patient educated on when it is appropriate to go to the emergency department.    I provided 15 minutes of non-face-to-face time during this encounter.    Worthy Rancher, MD

## 2018-11-27 ENCOUNTER — Other Ambulatory Visit: Payer: BLUE CROSS/BLUE SHIELD

## 2018-11-27 DIAGNOSIS — J441 Chronic obstructive pulmonary disease with (acute) exacerbation: Secondary | ICD-10-CM | POA: Diagnosis not present

## 2018-12-02 LAB — NOVEL CORONAVIRUS, NAA: SARS-CoV-2, NAA: NOT DETECTED

## 2018-12-04 ENCOUNTER — Encounter: Payer: Self-pay | Admitting: Family

## 2018-12-04 ENCOUNTER — Telehealth: Payer: Self-pay | Admitting: Family Medicine

## 2018-12-04 ENCOUNTER — Ambulatory Visit (INDEPENDENT_AMBULATORY_CARE_PROVIDER_SITE_OTHER): Payer: BC Managed Care – PPO | Admitting: Family

## 2018-12-04 DIAGNOSIS — J441 Chronic obstructive pulmonary disease with (acute) exacerbation: Secondary | ICD-10-CM | POA: Diagnosis not present

## 2018-12-04 MED ORDER — DOXYCYCLINE HYCLATE 100 MG PO TABS: 100.0000 mg | ORAL_TABLET | Freq: Two times a day (BID) | ORAL | 0 refills | Status: DC

## 2018-12-04 MED ORDER — PROMETHAZINE-DM 6.25-15 MG/5ML PO SYRP: 5.0000 mL | ORAL_SOLUTION | Freq: Three times a day (TID) | ORAL | 0 refills | Status: DC | PRN

## 2018-12-04 MED ORDER — PREDNISONE 10 MG (21) PO TBPK: ORAL_TABLET | ORAL | 0 refills | Status: DC

## 2018-12-04 NOTE — Progress Notes (Signed)
Virtual Visit via telephone Note  I connected with Jeff Walker on 12/04/18 at 3:20 pm by telephone and verified that I am speaking with the correct person using two identifiers. Jeff Walker is currently located at home and no one is currently with her during visit. The provider, Evelina Dun, FNP is located in their office at time of visit.  I discussed the limitations, risks, security and privacy concerns of performing an evaluation and management service by telephone and the availability of in person appointments. I also discussed with the patient that there may be a patient responsible charge related to this service. The patient expressed understanding and agreed to proceed.   History and Present Illness:  Pt calls the office today with recurrent cough. He had a telephone visit on 11/26/18 and diagnosed with COPD exacerbation was given Zpak, prednisone, and sent for COVID testing which was negative.   States he feels about the same, with productive cough and weakness.  Cough This is a recurrent problem. The current episode started 1 to 4 weeks ago. The problem has been waxing and waning. The problem occurs every few minutes. The cough is productive of purulent sputum. Associated symptoms include headaches, nasal congestion, rhinorrhea, shortness of breath and wheezing. Pertinent negatives include no ear congestion or ear pain. Risk factors for lung disease include smoking/tobacco exposure. He has tried rest, oral steroids and OTC cough suppressant for the symptoms. The treatment provided mild relief. His past medical history is significant for COPD.      Review of Systems  HENT: Positive for rhinorrhea. Negative for ear pain.   Respiratory: Positive for cough, shortness of breath and wheezing.   Neurological: Positive for headaches.  All other systems reviewed and are negative.    Observations/Objective: No SOB or distress noted, hoarse and productive cough  Assessment  and Plan: 1. Chronic obstructive pulmonary disease with acute exacerbation (HCC) - Take meds as prescribed - Use a cool mist humidifier  -Use saline nose sprays frequently -Force fluids -For any cough or congestion  Use plain Mucinex- regular strength or max strength is fine -For fever or aces or pains- take tylenol or ibuprofen. -Throat lozenges if help -Call office if symptoms worsen or do not improve  - doxycycline (VIBRA-TABS) 100 MG tablet; Take 1 tablet (100 mg total) by mouth 2 (two) times daily.  Dispense: 20 tablet; Refill: 0 - promethazine-dextromethorphan (PROMETHAZINE-DM) 6.25-15 MG/5ML syrup; Take 5 mLs by mouth 3 (three) times daily as needed for cough.  Dispense: 118 mL; Refill: 0 - predniSONE (STERAPRED UNI-PAK 21 TAB) 10 MG (21) TBPK tablet; Use as directed  Dispense: 21 tablet; Refill: 0    I discussed the assessment and treatment plan with the patient. The patient was provided an opportunity to ask questions and all were answered. The patient agreed with the plan and demonstrated an understanding of the instructions.   The patient was advised to call back or seek an in-person evaluation if the symptoms worsen or if the condition fails to improve as anticipated.  The above assessment and management plan was discussed with the patient. The patient verbalized understanding of and has agreed to the management plan. Patient is aware to call the clinic if symptoms persist or worsen. Patient is aware when to return to the clinic for a follow-up visit. Patient educated on when it is appropriate to go to the emergency department.   Time call ended:  3:29 pm  I provided 9 minutes of non-face-to-face time  during this encounter.    Evelina Dun, FNP

## 2018-12-04 NOTE — Telephone Encounter (Signed)
appt scheduled for continued congestion Pt notified

## 2019-03-25 ENCOUNTER — Telehealth: Payer: Self-pay | Admitting: Nurse Practitioner

## 2019-03-25 DIAGNOSIS — R0602 Shortness of breath: Secondary | ICD-10-CM

## 2019-03-25 DIAGNOSIS — R059 Cough, unspecified: Secondary | ICD-10-CM

## 2019-03-25 DIAGNOSIS — R05 Cough: Secondary | ICD-10-CM

## 2019-03-25 DIAGNOSIS — Z20822 Contact with and (suspected) exposure to covid-19: Secondary | ICD-10-CM

## 2019-03-25 DIAGNOSIS — Z20828 Contact with and (suspected) exposure to other viral communicable diseases: Secondary | ICD-10-CM

## 2019-03-25 MED ORDER — BENZONATATE 100 MG PO CAPS: 100.0000 mg | ORAL_CAPSULE | Freq: Three times a day (TID) | ORAL | 0 refills | Status: DC | PRN

## 2019-03-25 MED ORDER — ALBUTEROL SULFATE HFA 108 (90 BASE) MCG/ACT IN AERS: 2.0000 | INHALATION_SPRAY | Freq: Four times a day (QID) | RESPIRATORY_TRACT | 0 refills | Status: DC | PRN

## 2019-03-25 NOTE — Progress Notes (Signed)
E-Visit for Corona Virus Screening   Your current symptoms could be consistent with the coronavirus.  Many health care providers can now test patients at their office but not all are.  [facility] has multiple testing sites. For information on our COVID testing locations and hours go to [email or web address]  Please quarantine yourself while awaiting your test results.  We are enrolling you in our [place] for [facility] . Daily you will receive a questionnaire within the [facility] website. Our COVID 19 response team willl be monitoriing your responses daily.  You can go to one of the  testing sites listed below, while they are opened (see hours). You do not need a doctors order to be tested for covid.You do need to self-isolate until your results return and if positive 14 days from when your symptoms started and until you are 3 days symptom free.   Testing Locations (Monday - Friday, 10a.m. - 3:30 p.m.)   [place]: [facility] Entrance), [address] [place], [place]: [address], [place], [place], [place] (entrance off [address] (Closed each Monday): [address] [place], [place] - the short stay covered drive at [facility] (Use the [employer] entrance to [facility] next to [place] is a respiratory illness with symptoms that are similar to the flu. Symptoms are typically mild to moderate, but there have been cases of severe illness and death due to the virus. The following symptoms may appear 2-14 days after exposure: . Fever . Cough . Shortness of breath or difficulty breathing . Chills . Repeated shaking with chills . Muscle pain . Headache . Sore throat . New loss of taste or smell . Fatigue . Congestion or runny nose . Nausea or vomiting . Diarrhea  It is vitally  important that if you feel that you have an infection such as this virus or any other virus that you stay home and away from places where you may spread it to others.  You should self-quarantine for 14 days if you have symptoms that could potentially be coronavirus or have been in close contact a with a person diagnosed with COVID-19 within the last 2 weeks. You should avoid contact with people age 75 and older.   You should wear a mask or cloth face covering over your nose and mouth if you must be around other people or animals, including pets (even at home). Try to stay at least 6 feet away from other people. This will protect the people around you.  You can use medication such as A prescription cough medication called Tessalon Perles 100 mg. You may take 1-2 capsules every 8 hours as needed for cough and A prescription inhaler called Albuterol MDI 90 mcg /actuation 2 puffs every 4 hours as needed for shortness of breath, wheezing, cough  You may also take acetaminophen (Tylenol) as needed for fever.   Reduce your risk of any infection by using the same precautions used for avoiding the common cold or flu:  Marland Kitchen Wash your hands often with soap and warm water for at least 20 seconds.  If soap and water are not readily available, use an alcohol-based hand sanitizer with at least 60% alcohol.  . If coughing or sneezing, cover your mouth and nose by coughing or sneezing into the elbow areas of your shirt or  coat, into a tissue or into your sleeve (not your hands). . Avoid shaking hands with others and consider head nods or verbal greetings only. . Avoid touching your eyes, nose, or mouth with unwashed hands.  . Avoid close contact with people who are sick. . Avoid places or events with large numbers of people in one location, like concerts or sporting events. . Carefully consider travel plans you have or are making. . If you are planning any travel outside or inside the [place], visit the CDC's Travelers'  Health webpage for the latest health notices. . If you have some symptoms but not all symptoms, continue to monitor at home and seek medical attention if your symptoms worsen. . If you are having a medical emergency, call 911.  HOME CARE . Only take medications as instructed by your medical team. . Drink plenty of fluids and get plenty of rest. . A steam or ultrasonic humidifier can help if you have congestion.   GET HELP RIGHT AWAY IF YOU HAVE EMERGENCY WARNING SIGNS** FOR COVID-19. If you or someone is showing any of these signs seek emergency medical care immediately. Call 911 or proceed to your closest emergency facility if: . You develop worsening high fever. . Trouble breathing . Bluish lips or face . Persistent pain or pressure in the chest . New confusion . Inability to wake or stay awake . You cough up blood. . Your symptoms become more severe  **This list is not all possible symptoms. Contact your medical provider for any symptoms that are sever or concerning to you.   MAKE SURE YOU   Understand these instructions.  Will watch your condition.  Will get help right away if you are not doing well or get worse.  Your e-visit answers were reviewed by a board certified advanced clinical practitioner to complete your personal care plan.  Depending on the condition, your plan could have included both over the counter or prescription medications.  If there is a problem please reply once you have received a response from your provider.  Your safety is important to [place].  If you have drug allergies check your prescription carefully.    You can use MyChart to ask questions about today's visit, request a non-urgent call back, or ask for a work or school excuse for 24 hours related to this e-Visit. If it has been greater than 24 hours you will need to follow up with your provider, or enter a new e-Visit to address those concerns. You will get an e-mail in the next two days asking about  your experience.  I hope that your e-visit has been valuable and will speed your recovery. Thank you for using e-visits.   5-10 minutes spent reviewing and documenting in chart.

## 2019-06-04 ENCOUNTER — Encounter: Payer: Self-pay | Admitting: Family Medicine

## 2019-06-04 ENCOUNTER — Other Ambulatory Visit: Payer: Self-pay

## 2019-06-04 ENCOUNTER — Ambulatory Visit (INDEPENDENT_AMBULATORY_CARE_PROVIDER_SITE_OTHER): Payer: BC Managed Care – PPO | Admitting: Family Medicine

## 2019-06-04 DIAGNOSIS — J441 Chronic obstructive pulmonary disease with (acute) exacerbation: Secondary | ICD-10-CM

## 2019-06-04 MED ORDER — DOXYCYCLINE HYCLATE 100 MG PO TABS: 100.0000 mg | ORAL_TABLET | Freq: Two times a day (BID) | ORAL | 0 refills | Status: DC

## 2019-06-04 MED ORDER — ALBUTEROL SULFATE HFA 108 (90 BASE) MCG/ACT IN AERS: 2.0000 | INHALATION_SPRAY | Freq: Four times a day (QID) | RESPIRATORY_TRACT | 0 refills | Status: DC | PRN

## 2019-06-04 MED ORDER — PREDNISONE 10 MG (21) PO TBPK: ORAL_TABLET | ORAL | 0 refills | Status: DC

## 2019-06-04 MED ORDER — BENZONATATE 100 MG PO CAPS: 100.0000 mg | ORAL_CAPSULE | Freq: Three times a day (TID) | ORAL | 0 refills | Status: DC | PRN

## 2019-06-04 NOTE — Progress Notes (Signed)
Virtual Visit via telephone Note  I connected with Jeff Walker on 06/04/19 at 1403 by telephone and verified that I am speaking with the correct person using two identifiers. Jeff Walker is currently located at home and no other people are currently with her during visit. The provider, Fransisca Kaufmann Dettinger, MD is located in their office at time of visit.  Call ended at 1410  I discussed the limitations, risks, security and privacy concerns of performing an evaluation and management service by telephone and the availability of in person appointments. I also discussed with the patient that there may be a patient responsible charge related to this service. The patient expressed understanding and agreed to proceed.   History and Present Illness: Patient is calling in for 5 days of congestion and nasal congestion and achiness.  He has had chills and body aches.  He tested again and was negative on Tuesday. He feels chest tightness and low oxygen levels on phone.  He has not had any true fevers but he has felt feverish  1. Chronic obstructive pulmonary disease with acute exacerbation [facility])     Outpatient Encounter Medications as of 06/04/2019  Medication Sig  . albuterol (VENTOLIN HFA) 108 (90 Base) MCG/ACT inhaler Inhale 2 puffs into the lungs every 6 (six) hours as needed for wheezing or shortness of breath.  . benzonatate (TESSALON PERLES) 100 MG capsule Take 1 capsule (100 mg total) by mouth 3 (three) times daily as needed.  . doxycycline (VIBRA-TABS) 100 MG tablet Take 1 tablet (100 mg total) by mouth 2 (two) times daily.  . predniSONE (STERAPRED UNI-PAK 21 TAB) 10 MG (21) TBPK tablet Use as directed  . [DISCONTINUED] albuterol (VENTOLIN HFA) 108 (90 Base) MCG/ACT inhaler Inhale 2 puffs into the lungs every 6 (six) hours as needed for wheezing or shortness of breath.  . [DISCONTINUED] azithromycin (ZITHROMAX) 250 MG tablet Take 2 the first day and then one each day after.  .  [DISCONTINUED] benzonatate (TESSALON PERLES) 100 MG capsule Take 1 capsule (100 mg total) by mouth 3 (three) times daily as needed.  . [DISCONTINUED] doxycycline (VIBRA-TABS) 100 MG tablet Take 1 tablet (100 mg total) by mouth 2 (two) times daily.  . [DISCONTINUED] predniSONE (STERAPRED UNI-PAK 21 TAB) 10 MG (21) TBPK tablet Use as directed  . [DISCONTINUED] promethazine-dextromethorphan (PROMETHAZINE-DM) 6.25-15 MG/5ML syrup Take 5 mLs by mouth 3 (three) times daily as needed for cough.   No facility-administered encounter medications on file as of 06/04/2019.    Review of Systems  Constitutional: Positive for chills. Negative for fever.  HENT: Positive for congestion, postnasal drip, rhinorrhea, sore throat and voice change. Negative for ear discharge, ear pain, sinus pressure and sneezing.   Eyes: Negative for pain, discharge, redness and visual disturbance.  Respiratory: Positive for cough and chest tightness. Negative for shortness of breath and wheezing.   Cardiovascular: Negative for chest pain and leg swelling.  Musculoskeletal: Positive for myalgias.  Skin: Negative for rash.  All other systems reviewed and are negative.   Observations/Objective: Patient sounds comfortable and in no acute distress although he does sound a little hoarse  Assessment and Plan: Problem List Items Addressed This Visit    None    Visit Diagnoses    Chronic obstructive pulmonary disease with acute exacerbation (HCC)       Relevant Medications   albuterol (VENTOLIN HFA) 108 (90 Base) MCG/ACT inhaler   benzonatate (TESSALON PERLES) 100 MG capsule   doxycycline (VIBRA-TABS) 100 MG  tablet   predniSONE (STERAPRED UNI-PAK 21 TAB) 10 MG (21) TBPK tablet      Will treat with prednisone and doxycycline and then give him some cough medicine and albuterol to help.  If worsening or not improving return the call Follow up plan: Return if symptoms worsen or fail to improve.     I discussed the assessment  and treatment plan with the patient. The patient was provided an opportunity to ask questions and all were answered. The patient agreed with the plan and demonstrated an understanding of the instructions.   The patient was advised to call back or seek an in-person evaluation if the symptoms worsen or if the condition fails to improve as anticipated.  The above assessment and management plan was discussed with the patient. The patient verbalized understanding of and has agreed to the management plan. Patient is aware to call the clinic if symptoms persist or worsen. Patient is aware when to return to the clinic for a follow-up visit. Patient educated on when it is appropriate to go to the emergency department.    I provided 7 minutes of non-face-to-face time during this encounter.    Nils Pyle, MD

## 2021-10-30 ENCOUNTER — Ambulatory Visit (INDEPENDENT_AMBULATORY_CARE_PROVIDER_SITE_OTHER): Payer: BC Managed Care – PPO

## 2021-10-30 ENCOUNTER — Ambulatory Visit (INDEPENDENT_AMBULATORY_CARE_PROVIDER_SITE_OTHER): Payer: BC Managed Care – PPO | Admitting: Family Medicine

## 2021-10-30 ENCOUNTER — Encounter: Payer: Self-pay | Admitting: Family Medicine

## 2021-10-30 VITALS — BP 168/102 | HR 104 | Temp 97.9°F | Ht 72.0 in | Wt 177.8 lb

## 2021-10-30 DIAGNOSIS — M5441 Lumbago with sciatica, right side: Secondary | ICD-10-CM

## 2021-10-30 MED ORDER — GABAPENTIN 300 MG PO CAPS: 300.0000 mg | ORAL_CAPSULE | Freq: Every day | ORAL | 0 refills | Status: DC

## 2021-10-30 MED ORDER — METHOCARBAMOL 500 MG PO TABS
500.0000 mg | ORAL_TABLET | Freq: Three times a day (TID) | ORAL | 0 refills | Status: DC | PRN
Start: 2021-10-30 — End: 2021-12-21

## 2021-10-30 MED ORDER — METHYLPREDNISOLONE ACETATE 80 MG/ML IJ SUSP
80.0000 mg | Freq: Once | INTRAMUSCULAR | Status: AC
  Administered 2021-10-30: 80 mg via INTRAMUSCULAR

## 2021-10-30 MED ORDER — PREDNISONE 10 MG (21) PO TBPK: ORAL_TABLET | ORAL | 0 refills | Status: DC

## 2021-10-30 NOTE — Progress Notes (Unsigned)
Subjective: CC:*** PCP: Dettinger, Elige Radon, MD [id number] Jeff Walker is a 56 y.o. male presenting to clinic today for:  1. ***   ROS: Per HPI  No Known Allergies Past Medical History:  Diagnosis Date   Depression    Environmental allergies    Hypertension    Shingles    No current outpatient medications on file. Social History   Socioeconomic History   Marital status: Single    Spouse name: Not on file   Number of children: Not on file   Years of education: Not on file   Highest education level: Not on file  Occupational History   Not on file  Tobacco Use   Smoking status: Every Day    Packs/day: 1.00    Years: 35.00    Total pack years: 35.00    Types: Cigarettes   Smokeless tobacco: Never  Vaping Use   Vaping Use: Former  Substance and Sexual Activity   Alcohol use: Yes    Alcohol/week: 6.0 standard drinks of alcohol    Types: 6 Cans of beer per week    Comment: 1-2 beers per day  (makes his own beer)    Drug use: No   Sexual activity: Yes    Birth control/protection: Condom  Other Topics Concern   Not on file  Social History Narrative   Not on file   Social Determinants of Health   Financial Resource Strain: Not on file  Food Insecurity: Not on file  Transportation Needs: Not on file  Physical Activity: Not on file  Stress: Not on file  Social Connections: Not on file  Intimate Partner Violence: Not on file   Family History  Problem Relation Age of Onset   Hypertension Mother    Hypertension Father    Cancer Father        skin   Thyroid disease Sister    Cancer Maternal Grandmother    Stroke Paternal Grandmother    Colon cancer Neg Hx    Colon polyps Neg Hx     Objective: Office vital signs reviewed. BP (!) 177/113   Pulse (!) 104   Temp 97.9 F (36.6 C)   Ht 6' (1.829 m)   Wt 177 lb 12.8 oz (80.6 kg)   SpO2 97%   BMI 24.11 kg/m   Physical Examination:  General: Awake, alert, *** nourished, No acute distress HEENT: Normal     Neck: No masses palpated. No lymphadenopathy    Ears: Tympanic membranes intact, normal light reflex, no erythema, no bulging    Eyes: PERRLA, extraocular membranes intact, sclera ***    Nose: nasal turbinates moist, *** nasal discharge    Throat: moist mucus membranes, no erythema, *** tonsillar exudate.  Airway is patent Cardio: regular rate and rhythm, S1S2 heard, no murmurs appreciated Pulm: clear to auscultation bilaterally, no wheezes, rhonchi or rales; normal work of breathing on room air GI: soft, non-tender, non-distended, bowel sounds present x4, no hepatomegaly, no splenomegaly, no masses GU: external vaginal tissue ***, cervix ***, *** punctate lesions on cervix appreciated, *** discharge from cervical os, *** bleeding, *** cervical motion tenderness, *** abdominal/ adnexal masses Extremities: warm, well perfused, No edema, cyanosis or clubbing; +*** pulses bilaterally MSK: *** gait and *** station Skin: dry; intact; no rashes or lesions Neuro: *** Strength and light touch sensation grossly intact, *** DTRs ***/4  Assessment/ Plan: 56 y.o. male   ***  No orders of the defined types were placed in this encounter.  No orders of the defined types were placed in this encounter.    Janora Norlander, DO [place] [phone]

## 2021-10-30 NOTE — Patient Instructions (Signed)

## 2021-11-06 ENCOUNTER — Encounter: Payer: Self-pay | Admitting: Physical Therapy

## 2021-11-06 ENCOUNTER — Ambulatory Visit: Payer: BC Managed Care – PPO | Attending: Family Medicine | Admitting: Physical Therapy

## 2021-11-06 DIAGNOSIS — M5459 Other low back pain: Secondary | ICD-10-CM | POA: Diagnosis present

## 2021-11-06 DIAGNOSIS — M5441 Lumbago with sciatica, right side: Secondary | ICD-10-CM | POA: Diagnosis not present

## 2021-11-09 ENCOUNTER — Telehealth: Payer: Self-pay | Admitting: Family Medicine

## 2021-11-09 ENCOUNTER — Ambulatory Visit: Payer: BC Managed Care – PPO | Admitting: *Deleted

## 2021-11-09 DIAGNOSIS — M5459 Other low back pain: Secondary | ICD-10-CM

## 2021-11-09 NOTE — Therapy (Signed)
[facility] [address] [place], [place], [address] Phone: [phone]   Fax:  [phone]  Physical Therapy Treatment  Patient Details  Name: Jeff Walker MRN: [id number] Date of Birth: 23-Nov-1965 Referring Provider (PT): Delynn Flavin DO.   Encounter Date: 11/09/2021   PT End of Session - 11/09/21 1300     Visit Number 2    Number of Visits 6    Date for PT Re-Evaluation 12/04/21    PT Start Time 1300    PT Stop Time 1350    PT Time Calculation (min) 50 min             Past Medical History:  Diagnosis Date   Depression    Environmental allergies    Hypertension    Shingles     Past Surgical History:  Procedure Laterality Date   ESOPHAGUS SURGERY     multiple as child, from his description sounds like esophageal atresia    There were no vitals filed for this visit.   Subjective Assessment - 11/09/21 1301     Subjective pain is worst today 3/10    Pertinent History H/o elbow and knee pain, depression.    How long can you sit comfortably? Unlimited.    How long can you walk comfortably? Since being on pain medication he is able to walk farther without an assistive device.    Patient Stated Goals Get out of pain and back to work.    Currently in Pain? Yes    Pain Score 3     Pain Location Buttocks    Pain Orientation Right    Pain Descriptors / Indicators Shooting    Pain Type Acute pain                               [facility] Adult PT Treatment/Exercise - 11/09/21 0001       Self-Care   Self-Care ADL's;Posture    Posture ADLs and postures discussed and reviewed.      Exercises   Exercises Lumbar      Lumbar Exercises: Stretches   Piriformis Stretch Right;3 reps;20 seconds      Lumbar Exercises: Supine   Bridge 10 reps;20 reps   3x10     Lumbar Exercises: Sidelying   Clam Right;10 reps;20 reps   3x10                         PT Long Term Goals - 11/06/21  1412       PT LONG TERM GOAL #1   Title Independent with a HEP.    Time 3    Period Weeks    Status New      PT LONG TERM GOAL #2   Title Return to work.    Time 3    Period Weeks    Status New      PT LONG TERM GOAL #3   Title Perform ADL's with pain not > 1-2/10.    Time 3    Period Weeks    Status New                   Plan - 11/09/21 1309     Clinical Impression Statement Pt arrived today doing a little worse with RT hip/LBP. Rx focused on light core and RT hip exs and handout was given for HEP. ADL's and pain provoking act's were  reviewed. US/ combo perfromed to RT piriformis and tolerated well.    Comorbidities H/o elbow and knee pain, depression.    Examination-Activity Limitations Other;Locomotion Level    Examination-Participation Restrictions Other    Stability/Clinical Decision Making Stable/Uncomplicated    Rehab Potential Excellent    PT Frequency 2x / week    PT Duration 3 weeks    PT Treatment/Interventions ADLs/Self Care Home Management;Cryotherapy;Electrical Stimulation;Ultrasound;Moist Heat;Functional mobility training;Therapeutic activities;Therapeutic exercise;Patient/family education;Passive range of motion    PT Next Visit Plan FOTO...Marland KitchenPlease advance into core exercise program.  Modalities and STW/M as needed.    Consulted and Agree with Plan of Care Patient             Patient will benefit from skilled therapeutic intervention in order to improve the following deficits and impairments:  Pain, Decreased activity tolerance  Visit Diagnosis: Other low back pain     Problem List Patient Active Problem List   Diagnosis Date Noted   Dysphagia 08/27/2017   Depression, recurrent (HCC) 08/21/2017   Hypertension 09/20/2016   Rationale for Evaluation and Treatment Rehabilitation  Stevens Magwood,CHRIS, PTA 11/09/2021, 4:52 PM  [facility] [address] [place], [place], [address] Phone: [phone]    Fax:  [phone]  Name: Jeff Walker MRN: [id number] Date of Birth: August 11, 1965

## 2021-11-10 NOTE — Telephone Encounter (Signed)
LM to RC  

## 2021-11-10 NOTE — Telephone Encounter (Signed)
Was supposed to be seen 1 week after our appt.  I do not see that he has followed through with PCP.  Please have him schedule appropriate visit for further notes.

## 2021-11-15 ENCOUNTER — Ambulatory Visit: Payer: BC Managed Care – PPO | Attending: Family Medicine

## 2021-11-15 DIAGNOSIS — M5459 Other low back pain: Secondary | ICD-10-CM | POA: Diagnosis not present

## 2021-11-15 NOTE — Therapy (Signed)
OUTPATIENT PHYSICAL THERAPY TREATMENT NOTE   Patient Name: Jeff Walker MRN: [id number] DOB:1965/12/10, 56 y.o., male Today's Date: 11/15/2021  PCP: Dettinger, Elige Radon, MD REFERRING PROVIDER: Delynn Flavin DO.   PT End of Session - 11/15/21 1347     Visit Number 3    Number of Visits 6    Date for PT Re-Evaluation 12/04/21    PT Start Time 1345    PT Stop Time 1439    PT Time Calculation (min) 54 min             Past Medical History:  Diagnosis Date   Depression    Environmental allergies    Hypertension    Shingles    Past Surgical History:  Procedure Laterality Date   ESOPHAGUS SURGERY     multiple as child, from his description sounds like esophageal atresia   Patient Active Problem List   Diagnosis Date Noted   Dysphagia 08/27/2017   Depression, recurrent (HCC) 08/21/2017   Hypertension 09/20/2016    REFERRING DIAG: Acute right-sided low back pain with right-sided Sciatica  THERAPY DIAG:  Other low back pain  Rationale for Evaluation and Treatment Rehabilitation  PERTINENT HISTORY: H/o elbow and knee pain, depression.  PRECAUTIONS: None  SUBJECTIVE: Pt arrives for today's treatment session reporting feeling about the same.   PAIN:  Are you having pain? Yes: NPRS scale: 2/10 Pain location: Right hip and low back     TODAY'S TREATMENT:                                   7/5 -  EXERCISE LOG  Exercise Repetitions and Resistance Comments  NuStep Lvl 3 x 15 mins                    Blank cell = exercise not performed today   Manual Therapy Soft Tissue Mobilization: Right hip, STW/M to right hip musculature to decrease pain and tone with pt in left side-lying    Modalities  Date:  Unattended Estim: Hip, IFC 80-150 Hz, 15 mins, Pain and Tone Hot Pack: Hip, 15 mins, Pain and Tone     PATIENT EDUCATION: Education details: Continuation of HEP Person educated: Patient Education method: Explanation, Demonstration, Tactile cues, and  Verbal cues Education comprehension: verbalized understanding, returned demonstration, verbal cues required, tactile cues required, and needs further education      PT Long Term Goals - 11/15/21 1347       PT LONG TERM GOAL #1   Title Independent with a HEP.    Time 3    Period Weeks    Status New      PT LONG TERM GOAL #2   Title Return to work.    Time 3    Period Weeks    Status New      PT LONG TERM GOAL #3   Title Perform ADL's with pain not > 1-2/10.    Time 3    Period Weeks    Status New              Plan - 11/15/21 1348     Clinical Impression Statement Pt arrives for today's treatment session reporting 2/10 right hip pain.  Pt able to tolerate Nustep for warm-up without issue or complaint.  Treatment focused on STW/M to right hip musculature as well as right glute to decrease pain and tone.  Pt positioned in  left side-lying for comfort.  Normal responses to estim and MH noted upon removal  Pt reported 1/10 right hip pain at completion of today's treatmetn session.    Comorbidities H/o elbow and knee pain, depression.    Examination-Activity Limitations Other;Locomotion Level    Examination-Participation Restrictions Other    Stability/Clinical Decision Making Stable/Uncomplicated    Rehab Potential Excellent    PT Frequency 2x / week    PT Duration 3 weeks    PT Treatment/Interventions ADLs/Self Care Home Management;Cryotherapy;Electrical Stimulation;Ultrasound;Moist Heat;Functional mobility training;Therapeutic activities;Therapeutic exercise;Patient/family education;Passive range of motion    PT Next Visit Plan FOTO...Marland KitchenPlease advance into core exercise program.  Modalities and STW/M as needed.    Consulted and Agree with Plan of Care Patient               Newman Pies, PTA 11/15/2021, 2:41 PM

## 2021-11-15 NOTE — Telephone Encounter (Signed)
Encounter closed patient never returned call

## 2021-11-20 ENCOUNTER — Encounter: Payer: BC Managed Care – PPO | Admitting: Physical Therapy

## 2021-11-22 ENCOUNTER — Ambulatory Visit: Payer: BC Managed Care – PPO | Admitting: Physical Therapy

## 2021-11-27 ENCOUNTER — Ambulatory Visit: Payer: BC Managed Care – PPO | Admitting: Physical Therapy

## 2021-11-27 ENCOUNTER — Encounter: Payer: Self-pay | Admitting: Physical Therapy

## 2021-11-27 DIAGNOSIS — M5459 Other low back pain: Secondary | ICD-10-CM

## 2021-11-27 NOTE — Therapy (Signed)
  OUTPATIENT PHYSICAL THERAPY TREATMENT NOTE   Patient Name: Jeff Walker MRN: [id number] DOB:1966-02-02, 56 y.o., male Today's Date: 11/27/2021  PCP: Dettinger, Elige Radon, MD REFERRING PROVIDER: Delynn Flavin DO.   PT End of Session - 11/27/21 1418     Visit Number 4    Number of Visits 6    Date for PT Re-Evaluation 12/04/21    PT Start Time 0145    PT Stop Time 0233    PT Time Calculation (min) 48 min    Activity Tolerance Patient tolerated treatment well    Behavior During Therapy [facility] for tasks assessed/performed             Past Medical History:  Diagnosis Date   Depression    Environmental allergies    Hypertension    Shingles    Past Surgical History:  Procedure Laterality Date   ESOPHAGUS SURGERY     multiple as child, from his description sounds like esophageal atresia   Patient Active Problem List   Diagnosis Date Noted   Dysphagia 08/27/2017   Depression, recurrent (HCC) 08/21/2017   Hypertension 09/20/2016    REFERRING DIAG: Acute right-sided low back pain with right-sided Sciatica  THERAPY DIAG:  Other low back pain  Rationale for Evaluation and Treatment Rehabilitation  PERTINENT HISTORY: H/o elbow and knee pain, depression.  PRECAUTIONS: None  SUBJECTIVE: Pt arrives for today's treatment session reporting feeling about the same.   PAIN:  Are you having pain? Yes: NPRS scale: 2/10 Pain location: Right hip and low back     TODAY'S TREATMENT:                                   7/5 -  EXERCISE LOG  Exercise Repetitions and Resistance Comments  NuStep Lvl 3 x 15 mins   Back extension machine 60# x 4 minutes.   Ab curl machine 60# x 4 minutes.            Blank cell = exercise not performed today     Modalities  Date:  Right LB Unattended Estim: Hip, IFC 80-150 Hz, 40% scan x 20  mins, Pain and Tone Hot Pack: Hip, 20 mins, Pain and Tone  Clinical impression statement:  Improved gait with less antalgia noted.  Pain  continues to right anterior hip and groin region. Normal modality response following removal of modality.     PT Long Term Goals - 11/15/21 1347       PT LONG TERM GOAL #1   Title Independent with a HEP.    Time 3    Period Weeks    Status New      PT LONG TERM GOAL #2   Title Return to work.    Time 3    Period Weeks    Status New      PT LONG TERM GOAL #3   Title Perform ADL's with pain not > 1-2/10.    Time 3    Period Weeks    Status New                  Arnulfo Batson, [place], PT 11/27/2021, 2:35 PM

## 2021-11-30 ENCOUNTER — Ambulatory Visit: Payer: BC Managed Care – PPO

## 2021-11-30 DIAGNOSIS — M5459 Other low back pain: Secondary | ICD-10-CM | POA: Diagnosis not present

## 2021-11-30 NOTE — Therapy (Signed)
OUTPATIENT PHYSICAL THERAPY TREATMENT NOTE   Patient Name: Jeff Walker MRN: [id number] DOB:09-15-1965, 56 y.o., male Today's Date: 11/30/2021  PCP: Dettinger, Elige Radon, MD REFERRING PROVIDER: Delynn Flavin DO.   PT End of Session - 11/30/21 1350     Visit Number 5    Number of Visits 6    Date for PT Re-Evaluation 12/04/21    PT Start Time 1347    PT Stop Time 1438    PT Time Calculation (min) 51 min    Activity Tolerance Patient tolerated treatment well    Behavior During Therapy WFL for tasks assessed/performed             Past Medical History:  Diagnosis Date   Depression    Environmental allergies    Hypertension    Shingles    Past Surgical History:  Procedure Laterality Date   ESOPHAGUS SURGERY     multiple as child, from his description sounds like esophageal atresia   Patient Active Problem List   Diagnosis Date Noted   Dysphagia 08/27/2017   Depression, recurrent (HCC) 08/21/2017   Hypertension 09/20/2016    REFERRING DIAG: Acute right-sided low back pain with right-sided Sciatica  THERAPY DIAG:  Other low back pain  Rationale for Evaluation and Treatment Rehabilitation  PERTINENT HISTORY: H/o elbow and knee pain, depression.  PRECAUTIONS: None  SUBJECTIVE: Pt arrives for today's treatment session reporting feeling about the same.   PAIN:  Are you having pain? No   TODAY'S TREATMENT:                                   7/20 -  EXERCISE LOG  Exercise Repetitions and Resistance Comments  NuStep Lvl 4 x 15 mins   Back extension machine 70# x 4 minutes.   Ab curl machine 70# x 4 minutes.   Hip Flexion 4# x 20 reps, right   Hip Abduction 4# x 20 reps, right   Hip Extension 4# x 20 reps, right    Blank cell = exercise not performed today     Modalities  Date:  Right LB Unattended Estim: Hip, IFC 80-150 Hz, 40% scan x 15  mins, Pain and Tone Hot Pack: Hip, 15 mins, Pain and Tone       PT Long Term Goals - 11/30/21 1353        PT LONG TERM GOAL #1   Title Independent with a HEP.    Time 3    Period Weeks    Status On-going      PT LONG TERM GOAL #2   Title Return to work.    Time 3    Period Weeks    Status On-going      PT LONG TERM GOAL #3   Title Perform ADL's with pain not > 1-2/10.    Time 3    Period Weeks    Status On-going              Plan - 11/30/21 1352     Clinical Impression Statement Pt arrives for today's treatment session denying any pain, but reports right hip weakness.  Pt able to tolerate increased weight with lumbar extension and flexion without issue.  Min cues required for eccentric control.  Pt instructed in standing hip exercises to increased strength with min cues required for proper technique and posture.  Normal responses to estim and MH noted upon removal.  Pt denied any pain at completion of today's treatment session.    Comorbidities H/o elbow and knee pain, depression.    Examination-Activity Limitations Other;Locomotion Level    Examination-Participation Restrictions Other    Stability/Clinical Decision Making Stable/Uncomplicated    Rehab Potential Excellent    PT Frequency 2x / week    PT Duration 3 weeks    PT Treatment/Interventions ADLs/Self Care Home Management;Cryotherapy;Electrical Stimulation;Ultrasound;Moist Heat;Functional mobility training;Therapeutic activities;Therapeutic exercise;Patient/family education;Passive range of motion    PT Next Visit Plan FOTO...Marland KitchenPlease advance into core exercise program.  Modalities and STW/M as needed.    Consulted and Agree with Plan of Care Patient                Newman Pies, PTA 11/30/2021, 2:39 PM

## 2021-12-04 ENCOUNTER — Ambulatory Visit: Payer: BC Managed Care – PPO

## 2021-12-04 ENCOUNTER — Encounter: Payer: Self-pay | Admitting: Family Medicine

## 2021-12-04 ENCOUNTER — Ambulatory Visit (INDEPENDENT_AMBULATORY_CARE_PROVIDER_SITE_OTHER): Payer: BC Managed Care – PPO | Admitting: Family Medicine

## 2021-12-04 VITALS — BP 160/98 | HR 100 | Temp 97.1°F | Ht 72.0 in | Wt 173.6 lb

## 2021-12-04 DIAGNOSIS — I10 Essential (primary) hypertension: Secondary | ICD-10-CM

## 2021-12-04 DIAGNOSIS — M5459 Other low back pain: Secondary | ICD-10-CM | POA: Diagnosis not present

## 2021-12-04 DIAGNOSIS — M5441 Lumbago with sciatica, right side: Secondary | ICD-10-CM | POA: Diagnosis not present

## 2021-12-04 NOTE — Therapy (Signed)
OUTPATIENT PHYSICAL THERAPY TREATMENT NOTE   Patient Name: Jeff Walker MRN: [id number] DOB:04-28-1966, 56 y.o., male Today's Date: 12/04/2021  PCP: Dettinger, Elige Radon, MD REFERRING PROVIDER: Delynn Flavin DO.   PT End of Session - 12/04/21 1351     Visit Number 6    Number of Visits 6    Date for PT Re-Evaluation 12/04/21    PT Start Time 1350    Activity Tolerance Patient tolerated treatment well    Behavior During Therapy [facility] for tasks assessed/performed             Past Medical History:  Diagnosis Date   Depression    Environmental allergies    Hypertension    Shingles    Past Surgical History:  Procedure Laterality Date   ESOPHAGUS SURGERY     multiple as child, from his description sounds like esophageal atresia   Patient Active Problem List   Diagnosis Date Noted   Dysphagia 08/27/2017   Depression, recurrent (HCC) 08/21/2017   Hypertension 09/20/2016    REFERRING DIAG: Acute right-sided low back pain with right-sided Sciatica  THERAPY DIAG:  Other low back pain  Rationale for Evaluation and Treatment Rehabilitation  PERTINENT HISTORY: H/o elbow and knee pain, depression.  PRECAUTIONS: None  SUBJECTIVE: Pt arrives for today's treatment session reporting feeling about the same.  Pt states that he has to go back to the doctor next week to see if he can go back to work.  If hip is still bothering him, he will get an MRI.  PAIN:  Are you having pain? Yes: NPRS scale: 2/10 Pain location: right hip   TODAY'S TREATMENT:                                   7/24 -  EXERCISE LOG  Exercise Repetitions and Resistance Comments  NuStep Lvl 5 x 15 mins   Back extension machine 70# x 4 minutes.   Ab curl machine 70# x 4 minutes.   Hip Flexion    Hip Abduction    Hip Extension     Blank cell = exercise not performed today   Manual Therapy Soft Tissue Mobilization: right hip, STW/M performed to right hip to decrease pain and tone with pt  positioned in left side-lying    Modalities  Date:  Right LB Unattended Estim: Hip, IFC 80-150 Hz, 40% scan x 15  mins, Pain and Tone Hot Pack: Hip, 15 mins, Pain and Tone       PT Long Term Goals - 12/04/21 1351       PT LONG TERM GOAL #1   Title Independent with a HEP.    Time 3    Period Weeks    Status Achieved      PT LONG TERM GOAL #2   Title Return to work.    Time 3    Period Weeks    Status On-going      PT LONG TERM GOAL #3   Title Perform ADL's with pain not > 1-2/10.    Time 3    Period Weeks    Status Achieved              Plan - 12/04/21 1353     Comorbidities H/o elbow and knee pain, depression.    Examination-Activity Limitations Other;Locomotion Level    Examination-Participation Restrictions Other    Stability/Clinical Decision Making Stable/Uncomplicated    Rehab  Potential Excellent    PT Frequency 2x / week    PT Duration 3 weeks    PT Treatment/Interventions ADLs/Self Care Home Management;Cryotherapy;Electrical Stimulation;Ultrasound;Moist Heat;Functional mobility training;Therapeutic activities;Therapeutic exercise;Patient/family education;Passive range of motion    PT Next Visit Plan FOTO...Marland KitchenPlease advance into core exercise program.  Modalities and STW/M as needed.    Consulted and Agree with Plan of Care Patient                 Newman Pies, PTA 12/04/2021, 1:54 PM

## 2021-12-04 NOTE — Patient Instructions (Signed)
KEEP APPOINTMENT WITH DR Louanne Skye IN 1 WEEK. HE WILL NEED TO REASSESS ANY REFERRAL/ IMAGING NEEDS.

## 2021-12-04 NOTE — Progress Notes (Signed)
Subjective: CC: Follow-up back pain PCP: Dettinger, Elige Radon, MD WLS:LHTDSKA HOLMES Jeff Walker is a 56 y.o. male presenting to clinic today for:  1.  Back pain Patient reports that the back and leg pain have improved quite a bit since our last visit 1 month ago.  He has been out of the gabapentin, Robaxin and prednisone since our last visit but notes it was slightly helpful.  He has been going to physical therapy for about a month now with his last plan PT today.  Overall he feels better.  Continues to have some weakness in that right lower extremity but wants to try going back to work.   ROS: Per HPI  No Known Allergies Past Medical History:  Diagnosis Date   Depression    Environmental allergies    Hypertension    Shingles     Current Outpatient Medications:    gabapentin (NEURONTIN) 300 MG capsule, Take 1 capsule (300 mg total) by mouth at bedtime for 14 days. If needed for pain going down right leg, Disp: 14 capsule, Rfl: 0   methocarbamol (ROBAXIN) 500 MG tablet, Take 1 tablet (500 mg total) by mouth every 8 (eight) hours as needed for muscle spasms., Disp: 20 tablet, Rfl: 0   predniSONE (STERAPRED UNI-PAK 21 TAB) 10 MG (21) TBPK tablet, As directed x 6 days. Start 10/31/2021 (Patient not taking: Reported on 11/06/2021), Disp: 21 tablet, Rfl: 0 Social History   Socioeconomic History   Marital status: Single    Spouse name: Not on file   Number of children: Not on file   Years of education: Not on file   Highest education level: Not on file  Occupational History   Not on file  Tobacco Use   Smoking status: Every Day    Packs/day: 1.00    Years: 35.00    Total pack years: 35.00    Types: Cigarettes   Smokeless tobacco: Never  Vaping Use   Vaping Use: Former  Substance and Sexual Activity   Alcohol use: Yes    Alcohol/week: 6.0 standard drinks of alcohol    Types: 6 Cans of beer per week    Comment: 1-2 beers per day  (makes his own beer)    Drug use: No   Sexual activity:  Yes    Birth control/protection: Condom  Other Topics Concern   Not on file  Social History Narrative   Not on file   Social Determinants of Health   Financial Resource Strain: Not on file  Food Insecurity: Not on file  Transportation Needs: Not on file  Physical Activity: Not on file  Stress: Not on file  Social Connections: Not on file  Intimate Partner Violence: Not on file   Family History  Problem Relation Age of Onset   Hypertension Mother    Hypertension Father    Cancer Father        skin   Thyroid disease Sister    Cancer Maternal Grandmother    Stroke Paternal Grandmother    Colon cancer Neg Hx    Colon polyps Neg Hx     Objective: Office vital signs reviewed. BP (!) 160/98   Pulse 100   Temp (!) 97.1 F (36.2 C)   Ht 6' (1.829 m)   Wt 173 lb 9.6 oz (78.7 kg)   SpO2 99%   BMI 23.54 kg/m   Physical Examination:  General: Awake, alert, nontoxic male, No acute distress MSK: Ambulating independently.  Low back without midline tenderness  to palpation.  Right hip: Negative FABER.  Positive FADIR. 5/5 LE strength and light touch sensation is grossly intact  Assessment/ Plan: 56 y.o. male   Acute right-sided low back pain with right-sided sciatica  Uncontrolled hypertension  Reportedly better.  Physical exam was notable for positive pain with FADIR.  Okay to return to work Friday.  Advised to follow-up with PCP in 1 week so as to reassess and determine need for ongoing eval.  Additionally, blood pressure has been uncontrolled x2 visits here in office.  Needs to be assessed, have labs done and likely started on antihypertensive  No orders of the defined types were placed in this encounter.  No orders of the defined types were placed in this encounter.    Raliegh Ip, DO [facility] [phone]

## 2021-12-05 ENCOUNTER — Telehealth: Payer: Self-pay | Admitting: Family Medicine

## 2021-12-05 NOTE — Telephone Encounter (Signed)
FMLA forms were on Jeff Walker's desk. Forms given to Dr. Nadine Counts to sign and date. Please call pt when complete. Per OV note with Dr. Nadine Counts on 7/24 pt can return to work this Friday 7/28.

## 2021-12-06 NOTE — Telephone Encounter (Signed)
Pt came by office this morning and pick up forms

## 2021-12-13 ENCOUNTER — Ambulatory Visit (INDEPENDENT_AMBULATORY_CARE_PROVIDER_SITE_OTHER): Payer: BC Managed Care – PPO

## 2021-12-13 ENCOUNTER — Encounter: Payer: Self-pay | Admitting: Family Medicine

## 2021-12-13 ENCOUNTER — Ambulatory Visit (INDEPENDENT_AMBULATORY_CARE_PROVIDER_SITE_OTHER): Payer: BC Managed Care – PPO | Admitting: Family Medicine

## 2021-12-13 VITALS — BP 169/105 | HR 98 | Temp 98.0°F | Ht 72.0 in | Wt 177.0 lb

## 2021-12-13 DIAGNOSIS — M25551 Pain in right hip: Secondary | ICD-10-CM | POA: Diagnosis not present

## 2021-12-13 DIAGNOSIS — I10 Essential (primary) hypertension: Secondary | ICD-10-CM

## 2021-12-13 MED ORDER — METHYLPREDNISOLONE ACETATE 80 MG/ML IJ SUSP
80.0000 mg | Freq: Once | INTRAMUSCULAR | Status: AC
  Administered 2021-12-13: 80 mg via INTRAMUSCULAR

## 2021-12-13 MED ORDER — HYDROCHLOROTHIAZIDE 25 MG PO TABS: 25.0000 mg | ORAL_TABLET | Freq: Every day | ORAL | 3 refills | Status: DC

## 2021-12-13 NOTE — Addendum Note (Signed)
Addended by: Dorene Sorrow on: 12/13/2021 04:40 PM   Modules accepted: Orders

## 2021-12-13 NOTE — Progress Notes (Signed)
BP (!) 169/105   Pulse 98   Temp 98 F (36.7 C)   Ht 6' (1.829 m)   Wt 177 lb (80.3 kg)   SpO2 97%   BMI 24.01 kg/m    Subjective:   Patient ID: Jeff Walker, male    DOB: 1965-07-18, 56 y.o.   MRN: [id number]  HPI: Jeff Walker is a 56 y.o. male presenting on 12/13/2021 for Medical Management of Chronic Issues and Hip Pain (Right. Radiates to right buttock and down right groin. PT not helping. Some days pt has a hard time walking)   HPI Hypertension Patient is currently on no medicine currently, used to be on hydrochlorothiazide but has been out of it for couple years, and their blood pressure today is 169/105. Patient denies any lightheadedness or dizziness. Patient denies blurred vision, chest pains, shortness of breath, or weakness. Denies any side effects from medication and is content with current medication.  He does complain of headaches occasionally.  Hip pain Patient is coming in today with right hip/groin pain.  It hurts more when he is up walking around.  He has been doing physical therapy and he did some treatments a few weeks ago and thought it was getting better but now its been worse over the past few days.  Relevant past medical, surgical, family and social history reviewed and updated as indicated. Interim medical history since our last visit reviewed. Allergies and medications reviewed and updated.  Review of Systems  Constitutional:  Negative for chills and fever.  Eyes:  Negative for discharge.  Respiratory:  Negative for shortness of breath and wheezing.   Cardiovascular:  Negative for chest pain and leg swelling.  Musculoskeletal:  Positive for arthralgias, back pain and gait problem.  Skin:  Negative for rash.  All other systems reviewed and are negative.   Per HPI unless specifically indicated above   Allergies as of 12/13/2021   No Known Allergies      Medication List        Accurate as of December 13, 2021  4:27 PM. If you have any  questions, ask your nurse or doctor.          hydrochlorothiazide 25 MG tablet Commonly known as: HYDRODIURIL Take 1 tablet (25 mg total) by mouth daily. Started by: Nils Pyle, MD   methocarbamol 500 MG tablet Commonly known as: ROBAXIN Take 1 tablet (500 mg total) by mouth every 8 (eight) hours as needed for muscle spasms.         Objective:   BP (!) 169/105   Pulse 98   Temp 98 F (36.7 C)   Ht 6' (1.829 m)   Wt 177 lb (80.3 kg)   SpO2 97%   BMI 24.01 kg/m   Wt Readings from Last 3 Encounters:  12/13/21 177 lb (80.3 kg)  12/04/21 173 lb 9.6 oz (78.7 kg)  10/30/21 177 lb 12.8 oz (80.6 kg)    Physical Exam Vitals and nursing note reviewed.  Constitutional:      General: He is not in acute distress.    Appearance: He is well-developed. He is not diaphoretic.  Eyes:     General: No scleral icterus.       Right eye: No discharge.     Conjunctiva/sclera: Conjunctivae normal.     Pupils: Pupils are equal, round, and reactive to light.  Neck:     Thyroid: No thyromegaly.  Cardiovascular:     Rate and Rhythm: Normal rate  and regular rhythm.     Heart sounds: Normal heart sounds. No murmur heard. Pulmonary:     Effort: Pulmonary effort is normal. No respiratory distress.     Breath sounds: Normal breath sounds. No wheezing.  Musculoskeletal:        General: Normal range of motion.     Cervical back: Neck supple.     Lumbar back: No tenderness or bony tenderness. Normal range of motion. Negative right straight leg raise test and negative left straight leg raise test.     Right hip: Tenderness present. No deformity or crepitus. Normal range of motion. Normal strength.  Lymphadenopathy:     Cervical: No cervical adenopathy.  Skin:    General: Skin is warm and dry.     Findings: No rash.  Neurological:     Mental Status: He is alert and oriented to person, place, and time.     Coordination: Coordination normal.  Psychiatric:        Behavior: Behavior  normal.     Right hip x-ray: Await final read from radiology  Assessment & Plan:   Problem List Items Addressed This Visit       Cardiovascular and Mediastinum   Hypertension - Primary   Relevant Medications   hydrochlorothiazide (HYDRODIURIL) 25 MG tablet   Other Visit Diagnoses     Right hip pain       Relevant Medications   methylPREDNISolone acetate (DEPO-MEDROL) injection 80 mg (Start on 12/13/2021  4:30 PM)   Other Relevant Orders   DG HIP UNILAT W OR W/O PELVIS 2-3 VIEWS RIGHT       We will give steroid injection and right hip x-ray, still doing physical therapy. Follow up plan: Return in about 3 months (around 03/15/2022), or if symptoms worsen or fail to improve, for Hypertension recheck.  Counseling provided for all of the vaccine components Orders Placed This Encounter  Procedures   DG HIP UNILAT W OR W/O PELVIS 2-3 VIEWS RIGHT    Arville Care, MD [facility] 12/13/2021, 4:27 PM

## 2021-12-14 LAB — CMP14+EGFR
ALT: 8 IU/L (ref 0–44)
AST: 7 IU/L (ref 0–40)
Albumin/Globulin Ratio: 1.5 (ref 1.2–2.2)
Albumin: 4.2 g/dL (ref 3.8–4.9)
Alkaline Phosphatase: 97 IU/L (ref 44–121)
BUN/Creatinine Ratio: 12 (ref 9–20)
BUN: 14 mg/dL (ref 6–24)
Bilirubin Total: 0.3 mg/dL (ref 0.0–1.2)
CO2: 23 mmol/L (ref 20–29)
Calcium: 8.8 mg/dL (ref 8.7–10.2)
Chloride: 100 mmol/L (ref 96–106)
Creatinine, Ser: 1.14 mg/dL (ref 0.76–1.27)
Globulin, Total: 2.8 g/dL (ref 1.5–4.5)
Glucose: 100 mg/dL — ABNORMAL HIGH (ref 70–99)
Potassium: 3.9 mmol/L (ref 3.5–5.2)
Sodium: 141 mmol/L (ref 134–144)
Total Protein: 7 g/dL (ref 6.0–8.5)
eGFR: 75 mL/min/{1.73_m2} (ref >59)

## 2021-12-14 LAB — CBC WITH DIFFERENTIAL/PLATELET
Basophils Absolute: 0.1 10*3/uL (ref 0.0–0.2)
Basos: 1 %
EOS (ABSOLUTE): 0.2 10*3/uL (ref 0.0–0.4)
Eos: 2 %
Hematocrit: 48.4 % (ref 37.5–51.0)
Hemoglobin: 16.3 g/dL (ref 13.0–17.7)
Immature Grans (Abs): 0 10*3/uL (ref 0.0–0.1)
Immature Granulocytes: 0 %
Lymphocytes Absolute: 2.9 10*3/uL (ref 0.7–3.1)
Lymphs: 30 %
MCH: 29.5 pg (ref 26.6–33.0)
MCHC: 33.7 g/dL (ref 31.5–35.7)
MCV: 88 fL (ref 79–97)
Monocytes Absolute: 0.5 10*3/uL (ref 0.1–0.9)
Monocytes: 5 %
Neutrophils Absolute: 6.2 10*3/uL (ref 1.4–7.0)
Neutrophils: 62 %
Platelets: 334 10*3/uL (ref 150–450)
RBC: 5.52 x10E6/uL (ref 4.14–5.80)
RDW: 13.4 % (ref 11.6–15.4)
WBC: 9.8 10*3/uL (ref 3.4–10.8)

## 2021-12-14 LAB — LIPID PANEL
Chol/HDL Ratio: 4.5 ratio (ref 0.0–5.0)
Cholesterol, Total: 208 mg/dL — ABNORMAL HIGH (ref 100–199)
HDL: 46 mg/dL (ref >39)
LDL Chol Calc (NIH): 147 mg/dL — ABNORMAL HIGH (ref 0–99)
Triglycerides: 85 mg/dL (ref 0–149)
VLDL Cholesterol Cal: 15 mg/dL (ref 5–40)

## 2021-12-18 ENCOUNTER — Telehealth: Payer: Self-pay | Admitting: Family Medicine

## 2021-12-18 NOTE — Telephone Encounter (Signed)
He probably needs to see if his work does an Transport planner form at this point, I cannot recall if he had done before.  He also needs to follow-up at least 6 weeks from his follow-up appointment and I believe he is doing physical therapy as well.  At that follow-up visit once he is done the physical therapy and everything then we can consider MRI

## 2021-12-18 NOTE — Telephone Encounter (Signed)
Left message to return call.  Pt had FMLA forms completed already and has picked them up

## 2021-12-20 ENCOUNTER — Encounter: Payer: Self-pay | Admitting: Family Medicine

## 2021-12-21 ENCOUNTER — Encounter: Payer: Self-pay | Admitting: Family Medicine

## 2021-12-21 ENCOUNTER — Ambulatory Visit (INDEPENDENT_AMBULATORY_CARE_PROVIDER_SITE_OTHER): Payer: BC Managed Care – PPO | Admitting: Family Medicine

## 2021-12-21 VITALS — BP 157/111 | HR 89 | Temp 98.0°F | Ht 72.0 in | Wt 175.0 lb

## 2021-12-21 DIAGNOSIS — M5441 Lumbago with sciatica, right side: Secondary | ICD-10-CM

## 2021-12-21 DIAGNOSIS — M25551 Pain in right hip: Secondary | ICD-10-CM

## 2021-12-21 MED ORDER — DICLOFENAC SODIUM 75 MG PO TBEC: 75.0000 mg | DELAYED_RELEASE_TABLET | Freq: Two times a day (BID) | ORAL | 3 refills | Status: DC

## 2021-12-21 NOTE — Progress Notes (Signed)
BP (!) 157/111   Pulse 89   Temp 98 F (36.7 C)   Ht 6' (1.829 m)   Wt 175 lb (79.4 kg)   SpO2 98%   BMI 23.73 kg/m    Subjective:   Patient ID: Jeff Walker, male    DOB: 1965/06/22, 56 y.o.   MRN: [id number]  HPI: Jeff Walker is a 56 y.o. male presenting on 12/21/2021 for Hip Pain (Right- needs documentation for work)   HPI Right hip/groin pain Patient says his pain now is more on the lateral side going down the lateral aspect of his leg but he says it still the same pain is still severe.  He said the methocarbamol did not help much.  His blood pressure is up a lot because he said he could not afford his hydrochlorothiazide and therefore has not been taking it today.  He said the steroid injection that we did last time did not help like it had previously.  Relevant past medical, surgical, family and social history reviewed and updated as indicated. Interim medical history since our last visit reviewed. Allergies and medications reviewed and updated.  Review of Systems  Constitutional:  Negative for chills and fever.  Respiratory:  Negative for shortness of breath and wheezing.   Cardiovascular:  Negative for chest pain and leg swelling.  Musculoskeletal:  Positive for arthralgias, back pain, gait problem and myalgias.  Skin:  Negative for rash.  All other systems reviewed and are negative.   Per HPI unless specifically indicated above   Allergies as of 12/21/2021   No Known Allergies      Medication List        Accurate as of December 21, 2021 11:30 AM. If you have any questions, ask your nurse or doctor.          STOP taking these medications    hydrochlorothiazide 25 MG tablet Commonly known as: HYDRODIURIL Stopped by: Elige Radon Reah Justo, MD   methocarbamol 500 MG tablet Commonly known as: ROBAXIN Stopped by: Elige Radon Thomas Rhude, MD       TAKE these medications    diclofenac 75 MG EC tablet Commonly known as: VOLTAREN Take 1 tablet (75 mg  total) by mouth 2 (two) times daily. Started by: Elige Radon Jermisha Hoffart, MD         Objective:   BP (!) 157/111   Pulse 89   Temp 98 F (36.7 C)   Ht 6' (1.829 m)   Wt 175 lb (79.4 kg)   SpO2 98%   BMI 23.73 kg/m   Wt Readings from Last 3 Encounters:  12/21/21 175 lb (79.4 kg)  12/13/21 177 lb (80.3 kg)  12/04/21 173 lb 9.6 oz (78.7 kg)    Physical Exam Vitals and nursing note reviewed.  Constitutional:      Appearance: Normal appearance.  Musculoskeletal:     Right hip: Tenderness present. Normal range of motion.       Legs:  Neurological:     Mental Status: He is alert.       Assessment & Plan:   Problem List Items Addressed This Visit   None Visit Diagnoses     Right hip pain    -  Primary   Relevant Medications   diclofenac (VOLTAREN) 75 MG EC tablet   Acute right-sided low back pain with right-sided sciatica       Relevant Medications   diclofenac (VOLTAREN) 75 MG EC tablet     Today seems like  possibly more like a bursitis.  Will give him diclofenac to see if it helps.  Recommended that he did need to get his hydrochlorothiazide as soon as he could get back on it.  Follow up plan: Return if symptoms worsen or fail to improve.  Counseling provided for all of the vaccine components No orders of the defined types were placed in this encounter.   Arville Care, MD [facility] 12/21/2021, 11:30 AM

## 2022-01-10 ENCOUNTER — Encounter: Payer: Self-pay | Admitting: Family Medicine

## 2022-01-23 ENCOUNTER — Encounter: Payer: Self-pay | Admitting: Orthopedic Surgery

## 2022-01-23 ENCOUNTER — Ambulatory Visit (INDEPENDENT_AMBULATORY_CARE_PROVIDER_SITE_OTHER): Payer: BC Managed Care – PPO | Admitting: Orthopedic Surgery

## 2022-01-23 VITALS — BP 168/101 | HR 58 | Ht 72.0 in | Wt 170.0 lb

## 2022-01-23 DIAGNOSIS — G8929 Other chronic pain: Secondary | ICD-10-CM | POA: Diagnosis not present

## 2022-01-23 DIAGNOSIS — M5441 Lumbago with sciatica, right side: Secondary | ICD-10-CM | POA: Diagnosis not present

## 2022-01-23 DIAGNOSIS — M5442 Lumbago with sciatica, left side: Secondary | ICD-10-CM

## 2022-01-23 NOTE — Progress Notes (Signed)
New Patient Visit  Assessment: Jeff Walker is a 56 y.o. male with the following: 1. Chronic midline low back pain with bilateral sciatica  Plan: OLAJUWON FOSDICK has pain in the lower back, with radiating pains into both legs.  The pain is worse on the right side of his lower back, as is the pain radiating into his right leg.  Physical therapy worsened his symptoms.  At this point, I think it is reasonable to obtain an MRI.  We will place the order, he will return to clinic once the results are available.  Follow-up: Return for After MRI.  Subjective:  Chief Complaint  Patient presents with   Back Pain    Lumbar pain  since May woke up in pain has gotten worse since then     History of Present Illness: Jeff Walker is a 56 y.o. male who has been referred by Jeff Walker for evaluation of low back pain.  He reports severe pain in his lower back since May.  At that point, he bent over to pick something up.  He noticed a slight pull at that time, but it has progressively worsened.  He now has severe pain in the lower back, with radiating pains into both legs.  It is worse on the right.  He was sent to physical therapy, and tried 6-7 sessions.  However, he reports that his pain is gotten a lot worse.  He is unable to work in his current state.  He is using a cane to assist with ambulation.  His pain and associated symptoms gets worse when he stands.  Pain starts in the right buttock, and radiates distally, just proximal to the knee.  Review of Systems: No fevers or chills + numbness and tingling No chest pain No shortness of breath No bowel or bladder dysfunction No GI distress No headaches   Medical History:  Past Medical History:  Diagnosis Date   Depression    Environmental allergies    Hypertension    Shingles     Past Surgical History:  Procedure Laterality Date   ESOPHAGUS SURGERY     multiple as child, from his description sounds like esophageal atresia     Family History  Problem Relation Age of Onset   Hypertension Mother    Hypertension Father    Cancer Father        skin   Thyroid disease Sister    Cancer Maternal Grandmother    Stroke Paternal Grandmother    Colon cancer Neg Hx    Colon polyps Neg Hx    Social History   Tobacco Use   Smoking status: Every Day    Packs/day: 1.00    Years: 35.00    Total pack years: 35.00    Types: Cigarettes   Smokeless tobacco: Never  Vaping Use   Vaping Use: Former  Substance Use Topics   Alcohol use: Yes    Alcohol/week: 6.0 standard drinks of alcohol    Types: 6 Cans of beer per week    Comment: 1-2 beers per day  (makes his own beer)    Drug use: No    No Known Allergies  Current Meds  Medication Sig   [DISCONTINUED] diclofenac (VOLTAREN) 75 MG EC tablet Take 1 tablet (75 mg total) by mouth 2 (two) times daily.    Objective: BP (!) 168/101   Pulse (!) 58   Ht 6' (1.829 m)   Wt 170 lb (77.1 kg)   BMI  23.06 kg/m   Physical Exam:  General: Alert and oriented. and No acute distress. Gait: Ambulates with the assistance of a cane.  Hunched over posture.  Tenderness palpation along the lower back.  Negative straight leg raise bilaterally.  He looks uncomfortable when he is walking.  Strength in bilateral lower extremities is 5/5.  Sensation is intact over the dorsum of his feet.  Perfused.    IMAGING: I personally reviewed images previously obtained in clinic  IMPRESSION: 1. Transitional anatomy of L5 with sacralization of L5. No fracture or traumatic malalignment. Mild degenerative changes. 2. Mild calcified atherosclerotic change in the abdominal aorta.  New Medications:  No orders of the defined types were placed in this encounter.     Oliver Barre, MD  01/23/2022 3:40 PM

## 2022-02-06 ENCOUNTER — Encounter: Payer: Self-pay | Admitting: *Deleted

## 2022-03-08 ENCOUNTER — Ambulatory Visit (HOSPITAL_COMMUNITY)
Admission: RE | Admit: 2022-03-08 | Discharge: 2022-03-08 | Disposition: A | Discharge status: Home / Self Care | Payer: Self-pay | Source: Ambulatory Visit | Attending: Orthopedic Surgery | Admitting: Orthopedic Surgery

## 2022-03-08 DIAGNOSIS — G8929 Other chronic pain: Secondary | ICD-10-CM | POA: Insufficient documentation

## 2022-03-08 DIAGNOSIS — M5441 Lumbago with sciatica, right side: Secondary | ICD-10-CM | POA: Insufficient documentation

## 2022-03-08 DIAGNOSIS — M5442 Lumbago with sciatica, left side: Secondary | ICD-10-CM | POA: Insufficient documentation

## 2022-03-12 ENCOUNTER — Encounter: Payer: Self-pay | Admitting: Family Medicine

## 2022-03-12 ENCOUNTER — Ambulatory Visit (INDEPENDENT_AMBULATORY_CARE_PROVIDER_SITE_OTHER): Payer: Self-pay | Admitting: Family Medicine

## 2022-03-12 VITALS — BP 165/130 | HR 119 | Temp 99.0°F | Ht 72.0 in | Wt 172.0 lb

## 2022-03-12 DIAGNOSIS — I10 Essential (primary) hypertension: Secondary | ICD-10-CM

## 2022-03-12 MED ORDER — AMLODIPINE BESYLATE 10 MG PO TABS: 10.0000 mg | ORAL_TABLET | Freq: Every day | ORAL | 3 refills | Status: DC

## 2022-03-12 MED ORDER — HYDROCHLOROTHIAZIDE 25 MG PO TABS: 25.0000 mg | ORAL_TABLET | Freq: Every day | ORAL | 3 refills | Status: DC

## 2022-03-12 NOTE — Progress Notes (Signed)
BP (!) 165/130   Pulse (!) 119   Temp 99 F (37.2 C)   Ht 6' (1.829 m)   Wt 172 lb (78 kg)   SpO2 95%   BMI 23.33 kg/m    Subjective:   Patient ID: Jeff Walker, male    DOB: 1965-12-25, 56 y.o.   MRN: [id number]  HPI: Jeff Walker is a 56 y.o. male presenting on 03/12/2022 for Medical Management of Chronic Issues   HPI Hypertension Patient is currently on hydrochlorothiazide, and their blood pressure today is 165/130. Patient denies any lightheadedness or dizziness. Patient denies blurred vision, chest pains, shortness of breath, or weakness. Denies any side effects from medication and is content with current medication.  He says he does get the occasional headaches most days but they are never severe  Back pain Patient had an MRI that showed bulging disc in the is in the works to get another visit with orthopedic and discuss possible fix or repair  Relevant past medical, surgical, family and social history reviewed and updated as indicated. Interim medical history since our last visit reviewed. Allergies and medications reviewed and updated.  Review of Systems  Constitutional:  Negative for chills and fever.  Eyes:  Negative for visual disturbance.  Respiratory:  Negative for shortness of breath and wheezing.   Cardiovascular:  Negative for chest pain and leg swelling.  Musculoskeletal:  Negative for back pain and gait problem.  Skin:  Negative for rash.  Neurological:  Positive for headaches. Negative for dizziness, weakness and light-headedness.  All other systems reviewed and are negative.   Per HPI unless specifically indicated above   Allergies as of 03/12/2022   No Known Allergies      Medication List        Accurate as of March 12, 2022  3:24 PM. If you have any questions, ask your nurse or doctor.          amLODipine 10 MG tablet Commonly known as: NORVASC Take 1 tablet (10 mg total) by mouth daily. Started by: Nils Pyle, MD    hydrochlorothiazide 25 MG tablet Commonly known as: HYDRODIURIL Take 1 tablet (25 mg total) by mouth daily.         Objective:   BP (!) 165/130   Pulse (!) 119   Temp 99 F (37.2 C)   Ht 6' (1.829 m)   Wt 172 lb (78 kg)   SpO2 95%   BMI 23.33 kg/m   Wt Readings from Last 3 Encounters:  03/12/22 172 lb (78 kg)  01/23/22 170 lb (77.1 kg)  12/21/21 175 lb (79.4 kg)    Physical Exam Vitals and nursing note reviewed.  Constitutional:      General: He is not in acute distress.    Appearance: He is well-developed. He is not diaphoretic.  Eyes:     General: No scleral icterus.    Conjunctiva/sclera: Conjunctivae normal.  Neck:     Thyroid: No thyromegaly.  Cardiovascular:     Rate and Rhythm: Normal rate and regular rhythm.     Heart sounds: Normal heart sounds. No murmur heard. Pulmonary:     Effort: Pulmonary effort is normal. No respiratory distress.     Breath sounds: Normal breath sounds. No wheezing.  Musculoskeletal:        General: Normal range of motion.     Cervical back: Neck supple.  Lymphadenopathy:     Cervical: No cervical adenopathy.  Skin:    General:  Skin is warm and dry.     Findings: No rash.  Neurological:     Mental Status: He is alert and oriented to person, place, and time.     Coordination: Coordination normal.  Psychiatric:        Behavior: Behavior normal.       Assessment & Plan:   Problem List Items Addressed This Visit       Cardiovascular and Mediastinum   Hypertension - Primary   Relevant Medications   hydrochlorothiazide (HYDRODIURIL) 25 MG tablet   amLODipine (NORVASC) 10 MG tablet    We will renew his hydrochlorothiazide and add amlodipine.  Recommended that he cut the amlodipine in half for the first week  Patient is a smoker and gave samples of breast tree to help with congestion chronically Follow up plan: Return in about 3 months (around 06/12/2022), or if symptoms worsen or fail to improve, for Hypertension  recheck.  Counseling provided for all of the vaccine components No orders of the defined types were placed in this encounter.   Caryl Pina, MD [facility] 03/12/2022, 3:24 PM

## 2022-03-13 ENCOUNTER — Ambulatory Visit: Payer: Self-pay | Admitting: Orthopedic Surgery

## 2022-03-13 ENCOUNTER — Encounter: Payer: Self-pay | Admitting: Orthopedic Surgery

## 2022-03-13 VITALS — Ht 72.0 in | Wt 172.0 lb

## 2022-03-13 DIAGNOSIS — G8929 Other chronic pain: Secondary | ICD-10-CM

## 2022-03-13 DIAGNOSIS — M5441 Lumbago with sciatica, right side: Secondary | ICD-10-CM

## 2022-03-13 DIAGNOSIS — M5442 Lumbago with sciatica, left side: Secondary | ICD-10-CM

## 2022-03-13 MED ORDER — CYCLOBENZAPRINE HCL 10 MG PO TABS: 10.0000 mg | ORAL_TABLET | Freq: Two times a day (BID) | ORAL | 0 refills | Status: DC | PRN

## 2022-03-13 NOTE — Progress Notes (Signed)
New Patient Visit  Assessment: Jeff Walker is a 56 y.o. male with the following: 1. Chronic midline low back pain with bilateral sciatica  Plan: CRAYTON SAVARESE has pain in the lower back, with radiating pains into both legs.  MRI of the lumbar spine demonstrates pressure on the exiting nerves in the lower lumbar spine.  We discussed the next treatment steps, and he would like to proceed with some injections.  We placed a referral to Dr. Ernestina Patches to have this completed.  Pending the efficacy of the injection, we may have to refer him to a back surgeon.  He states he is not interested in surgery, but understands the neck steps.  All questions are answered.  Flexeril was provided.    Follow-up: Return if symptoms worsen or fail to improve.  Subjective:  Chief Complaint  Patient presents with   Back Pain    LBP MRI results.     History of Present Illness: Jeff Walker is a 56 y.o. male who returns to clinic for repeat evaluation of lower back pain.  He continues to have pain, which radiates into his right leg primarily. He continues to use a cane to assist with ambulation.  He has not been able to sleep at night.   He has had an MRI and is here to discuss the results.  He does not want to consider surgery.  He wants to be able to return to work.   Review of Systems: No fevers or chills + numbness and tingling No chest pain No shortness of breath No bowel or bladder dysfunction No GI distress No headaches   Objective: Ht 6' (1.829 m)   Wt 172 lb (78 kg)   BMI 23.33 kg/m   Physical Exam:  General: Alert and oriented. and No acute distress. Gait: Ambulates with the assistance of a cane.  Hunched over posture.  Tenderness palpation along the lower back.  Negative straight leg raise bilaterally.  He looks uncomfortable when he is walking.  Strength in bilateral lower extremities is 5/5.  Sensation is intact over the dorsum of his feet. Toes are warm and well perfused.      IMAGING: I personally reviewed images previously obtained in clinic  Lumbar spine MRI  IMPRESSION: 1. L4-L5 right subarticular and foraminal disc extrusion, which effaces the right lateral recess and compresses the descending right L5 nerve roots. The extrusion likely also contacts the descending left L5 nerve roots. 2. Central annular fissure at L1-L2 could irritate adjacent nerve roots. 3. No spinal canal stenosis or neural foraminal narrowing.   New Medications:  Meds ordered this encounter  Medications   cyclobenzaprine (FLEXERIL) 10 MG tablet    Sig: Take 1 tablet (10 mg total) by mouth 2 (two) times daily as needed for muscle spasms.    Dispense:  30 tablet    Refill:  0      Mordecai Rasmussen, MD  03/13/2022 3:32 PM

## 2022-03-15 ENCOUNTER — Ambulatory Visit: Payer: BC Managed Care – PPO | Admitting: Family Medicine

## 2022-03-16 ENCOUNTER — Telehealth: Payer: Self-pay | Admitting: Physical Medicine and Rehabilitation

## 2022-03-16 NOTE — Telephone Encounter (Signed)
Pt states she returned call to [place] J. Please call pt about appt. Pt phone number is [phone].

## 2022-03-16 NOTE — Telephone Encounter (Signed)
Patient called. Returning a call to Brittney.  

## 2022-03-19 NOTE — Telephone Encounter (Signed)
Spoke with patient and scheduled injection and 2 week follow up

## 2022-03-19 NOTE — Telephone Encounter (Signed)
Spoke with patient and scheduled injection and 2 week follow up 

## 2022-03-20 ENCOUNTER — Other Ambulatory Visit: Payer: Self-pay

## 2022-03-26 ENCOUNTER — Ambulatory Visit: Payer: Self-pay

## 2022-03-26 ENCOUNTER — Ambulatory Visit (INDEPENDENT_AMBULATORY_CARE_PROVIDER_SITE_OTHER): Payer: Self-pay | Admitting: Physical Medicine and Rehabilitation

## 2022-03-26 VITALS — BP 178/109 | HR 111

## 2022-03-26 DIAGNOSIS — M5416 Radiculopathy, lumbar region: Secondary | ICD-10-CM

## 2022-03-26 MED ORDER — METHYLPREDNISOLONE ACETATE 80 MG/ML IJ SUSP
40.0000 mg | Freq: Once | INTRAMUSCULAR | Status: AC
  Administered 2022-03-26: 40 mg

## 2022-03-26 NOTE — Patient Instructions (Signed)

## 2022-03-26 NOTE — Progress Notes (Signed)
Numeric Pain Rating Scale and Functional Assessment Average Pain 4   In the last MONTH (on 0-10 scale) has pain interfered with the following?  1. General activity like being  able to carry out your everyday physical activities such as walking, climbing stairs, carrying groceries, or moving a chair?  Rating(9)   +Driver, -BT, -Dye Allergies.  Constant low back pain on right side that radiates to the right leg

## 2022-04-04 ENCOUNTER — Encounter (INDEPENDENT_AMBULATORY_CARE_PROVIDER_SITE_OTHER): Payer: Self-pay

## 2022-04-04 NOTE — Procedures (Signed)
Lumbosacral Transforaminal Epidural Steroid Injection - Sub-Pedicular Approach with Fluoroscopic Guidance  Patient: Jeff Walker      Date of Birth: 1965-10-10 MRN: [id number] PCP: Dettinger, Elige Radon, MD      Visit Date: 03/26/2022   Universal Protocol:    Date/Time: 03/26/2022  Consent Given By: the patient  Position: PRONE  Additional Comments: Vital signs were monitored before and after the procedure. Patient was prepped and draped in the usual sterile fashion. The correct patient, procedure, and site was verified.   Injection Procedure Details:   Procedure diagnoses: Lumbar radiculopathy [M54.16]    Meds Administered:  Meds ordered this encounter  Medications   methylPREDNISolone acetate (DEPO-MEDROL) injection 40 mg    Laterality: Right  Location/Site: L5 patient has a partially sacralized L5 segment.  Needle:5.0 in., 22 ga.  Short bevel or Quincke spinal needle  Needle Placement: Transforaminal  Findings:    -Comments: Excellent flow of contrast along the nerve, nerve root and into the epidural space.  Procedure Details: After squaring off the end-plates to get a true AP view, the C-arm was positioned so that an oblique view of the foramen as noted above was visualized. The target area is just inferior to the "nose of the scotty dog" or sub pedicular. The soft tissues overlying this structure were infiltrated with 2-3 ml. of 1% Lidocaine without Epinephrine.  The spinal needle was inserted toward the target using a "trajectory" view along the fluoroscope beam.  Under AP and lateral visualization, the needle was advanced so it did not puncture dura and was located close the 6 O'Clock position of the pedical in AP tracterory. Biplanar projections were used to confirm position. Aspiration was confirmed to be negative for CSF and/or blood. A 1-2 ml. volume of Isovue-250 was injected and flow of contrast was noted at each level. Radiographs were obtained for  documentation purposes.   After attaining the desired flow of contrast documented above, a 0.5 to 1.0 ml test dose of 0.25% Marcaine was injected into each respective transforaminal space.  The patient was observed for 90 seconds post injection.  After no sensory deficits were reported, and normal lower extremity motor function was noted,   the above injectate was administered so that equal amounts of the injectate were placed at each foramen (level) into the transforaminal epidural space.   Additional Comments:  The patient tolerated the procedure well Dressing: 2 x 2 sterile gauze and Band-Aid    Post-procedure details: Patient was observed during the procedure. Post-procedure instructions were reviewed.  Patient left the clinic in stable condition.

## 2022-04-04 NOTE — Progress Notes (Signed)
Jeff Walker - 56 y.o. male MRN [id number]  Date of birth: 29-May-1965  Office Visit Note: Visit Date: 03/26/2022 PCP: Dettinger, Elige Radon, MD Referred by: Dettinger, Elige Radon, MD  Subjective: Chief Complaint  Patient presents with   Lower Back - Pain   HPI:  Jeff Walker is a 56 y.o. male who comes in today at the request of Dr. Thane Edu for planned Right L5-S1 Lumbar Transforaminal epidural steroid injection with fluoroscopic guidance.  The patient has failed conservative care including home exercise, medications, time and activity modification.  This injection will be diagnostic and hopefully therapeutic.  Please see requesting physician notes for further details and justification. MRI reviewed with images and spine model.  MRI reviewed in the note below.  He has a partially sacralized L5 segment.   ROS Otherwise per HPI.  Assessment & Plan: Visit Diagnoses:    ICD-10-CM   1. Lumbar radiculopathy  M54.16 XR C-ARM NO REPORT    Epidural Steroid injection    methylPREDNISolone acetate (DEPO-MEDROL) injection 40 mg      Plan: No additional findings.   Meds & Orders:  Meds ordered this encounter  Medications   methylPREDNISolone acetate (DEPO-MEDROL) injection 40 mg    Orders Placed This Encounter  Procedures   XR C-ARM NO REPORT   Epidural Steroid injection    Follow-up: Return for visit to requesting provider as needed.   Procedures: No procedures performed  Lumbosacral Transforaminal Epidural Steroid Injection - Sub-Pedicular Approach with Fluoroscopic Guidance  Patient: Jeff Walker      Date of Birth: May 07, 1966 MRN: [id number] PCP: Dettinger, Elige Radon, MD      Visit Date: 03/26/2022   Universal Protocol:    Date/Time: 03/26/2022  Consent Given By: the patient  Position: PRONE  Additional Comments: Vital signs were monitored before and after the procedure. Patient was prepped and draped in the usual sterile fashion. The correct patient,  procedure, and site was verified.   Injection Procedure Details:   Procedure diagnoses: Lumbar radiculopathy [M54.16]    Meds Administered:  Meds ordered this encounter  Medications   methylPREDNISolone acetate (DEPO-MEDROL) injection 40 mg    Laterality: Right  Location/Site: L5 patient has a partially sacralized L5 segment.  Needle:5.0 in., 22 ga.  Short bevel or Quincke spinal needle  Needle Placement: Transforaminal  Findings:    -Comments: Excellent flow of contrast along the nerve, nerve root and into the epidural space.  Procedure Details: After squaring off the end-plates to get a true AP view, the C-arm was positioned so that an oblique view of the foramen as noted above was visualized. The target area is just inferior to the "nose of the scotty dog" or sub pedicular. The soft tissues overlying this structure were infiltrated with 2-3 ml. of 1% Lidocaine without Epinephrine.  The spinal needle was inserted toward the target using a "trajectory" view along the fluoroscope beam.  Under AP and lateral visualization, the needle was advanced so it did not puncture dura and was located close the 6 O'Clock position of the pedical in AP tracterory. Biplanar projections were used to confirm position. Aspiration was confirmed to be negative for CSF and/or blood. A 1-2 ml. volume of Isovue-250 was injected and flow of contrast was noted at each level. Radiographs were obtained for documentation purposes.   After attaining the desired flow of contrast documented above, a 0.5 to 1.0 ml test dose of 0.25% Marcaine was injected into each respective transforaminal space.  The patient was observed for 90 seconds post injection.  After no sensory deficits were reported, and normal lower extremity motor function was noted,   the above injectate was administered so that equal amounts of the injectate were placed at each foramen (level) into the transforaminal epidural space.   Additional  Comments:  The patient tolerated the procedure well Dressing: 2 x 2 sterile gauze and Band-Aid    Post-procedure details: Patient was observed during the procedure. Post-procedure instructions were reviewed.  Patient left the clinic in stable condition.    Clinical History: MRI LUMBAR SPINE WITHOUT CONTRAST   TECHNIQUE: Multiplanar, multisequence MR imaging of the lumbar spine was performed. No intravenous contrast was administered.   COMPARISON:  Prior MRI, correlation is made with 10/30/2021   FINDINGS: Segmentation: Transitional anatomy, with partial sacralization of L5. The last fully formed disc space is at L5-S1.   Alignment:  Mild levocurvature.  Trace retrolisthesis of L1 on L2.   Vertebrae:  No fracture, evidence of discitis, or bone lesion.   Conus medullaris and cauda equina: Conus extends to the T12-L1 level. Conus and cauda equina appear normal.   Paraspinal and other soft tissues: Negative.   Disc levels:   T12-L1: Minimal disc bulge. No spinal canal stenosis or neural foraminal narrowing.   L1-L2: Trace retrolisthesis with minimal disc bulge and central annular fissure. No spinal canal stenosis or neural foraminal narrowing.   L2-L3: Minimal disc bulge. No spinal canal stenosis or neural foraminal narrowing.   L3-L4: No significant disc bulge. No spinal canal stenosis or neural foraminal narrowing.   L4-L5: Mild disc bulge with superimposed right subarticular and foraminal disc extrusion, which demonstrates 10 mm caudal migration. This effaces the right lateral recess and compresses the descending right L5 nerve roots, likely also contacting the descending left L5 nerve roots. No spinal canal stenosis. No neural foraminal narrowing.   L5-S1: No significant disc bulge. No spinal canal stenosis or neural foraminal narrowing.   IMPRESSION: 1. L4-L5 right subarticular and foraminal disc extrusion, which effaces the right lateral recess and  compresses the descending right L5 nerve roots. The extrusion likely also contacts the descending left L5 nerve roots. 2. Central annular fissure at L1-L2 could irritate adjacent nerve roots. 3. No spinal canal stenosis or neural foraminal narrowing.     Electronically Signed   By: Wiliam Ke M.D.   On: 03/10/2022 04:36     Objective:  VS:  HT:    WT:   BMI:     BP:(!) 178/109  HR:(!) 111bpm  TEMP: ( )  RESP:  Physical Exam Vitals and nursing note reviewed.  Constitutional:      General: He is not in acute distress.    Appearance: Normal appearance. He is not ill-appearing.  HENT:     Head: Normocephalic and atraumatic.     Right Ear: External ear normal.     Left Ear: External ear normal.     Nose: No congestion.  Eyes:     Extraocular Movements: Extraocular movements intact.  Cardiovascular:     Rate and Rhythm: Normal rate.     Pulses: Normal pulses.  Pulmonary:     Effort: Pulmonary effort is normal. No respiratory distress.  Abdominal:     General: There is no distension.     Palpations: Abdomen is soft.  Musculoskeletal:        General: No tenderness or signs of injury.     Cervical back: Neck supple.  Right lower leg: No edema.     Left lower leg: No edema.     Comments: Patient has good distal strength without clonus.  Skin:    Findings: No erythema or rash.  Neurological:     General: No focal deficit present.     Mental Status: He is alert and oriented to person, place, and time.     Sensory: No sensory deficit.     Motor: No weakness or abnormal muscle tone.     Coordination: Coordination normal.  Psychiatric:        Mood and Affect: Mood normal.        Behavior: Behavior normal.      Imaging: No results found.

## 2022-04-10 ENCOUNTER — Ambulatory Visit: Payer: Self-pay | Admitting: Physical Medicine and Rehabilitation

## 2022-04-12 ENCOUNTER — Ambulatory Visit (INDEPENDENT_AMBULATORY_CARE_PROVIDER_SITE_OTHER): Payer: Self-pay | Admitting: Family Medicine

## 2022-04-12 ENCOUNTER — Encounter: Payer: Self-pay | Admitting: Family Medicine

## 2022-04-12 DIAGNOSIS — R062 Wheezing: Secondary | ICD-10-CM

## 2022-04-12 DIAGNOSIS — J069 Acute upper respiratory infection, unspecified: Secondary | ICD-10-CM

## 2022-04-12 MED ORDER — ALBUTEROL SULFATE HFA 108 (90 BASE) MCG/ACT IN AERS: 2.0000 | INHALATION_SPRAY | Freq: Four times a day (QID) | RESPIRATORY_TRACT | 2 refills | Status: DC | PRN

## 2022-04-12 NOTE — Progress Notes (Signed)
Virtual Visit  Note Due to COVID-19 pandemic this visit was conducted virtually. This visit type was conducted due to national recommendations for restrictions regarding the COVID-19 Pandemic (e.g. social distancing, sheltering in place) in an effort to limit this patient's exposure and mitigate transmission in our community. All issues noted in this document were discussed and addressed.  A physical exam was not performed with this format.  I connected with [name] on 04/12/22 at 1248 by telephone and verified that I am speaking with the correct person using two identifiers. Jeff Walker is currently located at home and his family is currently with him during [place]. The provider, Gabriel Earing, FNP is located in their office at time of visit.  I discussed the limitations, risks, security and privacy concerns of performing an evaluation and management service by telephone and the availability of in person appointments. I also discussed with the patient that there may be a patient responsible charge related to this service. The patient expressed understanding and agreed to proceed.   History and Present Illness:  URI  This is a new problem. Episode onset: 2 days. The problem has been gradually worsening. Maximum temperature: subjective. Associated symptoms include congestion, coughing, headaches, nausea and wheezing. Pertinent negatives include no abdominal pain, chest pain, diarrhea, joint pain, joint swelling, sore throat or vomiting. Associated symptoms comments: hoarseness. He has tried acetaminophen for the symptoms. The treatment provided moderate relief.   Hx of COPD, smoker. He does not have any inhalers at home.    Review of Systems  HENT:  Positive for congestion. Negative for sore throat.   Respiratory:  Positive for cough and wheezing.   Cardiovascular:  Negative for chest pain.  Gastrointestinal:  Positive for nausea. Negative for abdominal pain, diarrhea and  vomiting.  Musculoskeletal:  Negative for joint pain.  Neurological:  Positive for headaches.     Observations/Objective: Alert and oriented x 3. Able to speak in full sentences without difficulty. Hoarseness noted.    Assessment and Plan: Diagnoses and all orders for this visit:  Acute URI He will come by for Covid and flu. Will notify patient of results when available. Discussed symptomatic care and return precautions.  -     Novel Coronavirus, NAA ([facility]); Future -     Veritor Flu A/B Waived; Future  Wheezing Albuterol prn.  -     albuterol (VENTOLIN HFA) 108 (90 Base) MCG/ACT inhaler; Inhale 2 puffs into the lungs every 6 (six) hours as needed for wheezing or shortness of breath.     Follow Up Instructions: Return to office for new or worsening symptoms, or if symptoms persist.     I discussed the assessment and treatment plan with the patient. The patient was provided an opportunity to ask questions and all were answered. The patient agreed with the plan and demonstrated an understanding of the instructions.   The patient was advised to call back or seek an in-person evaluation if the symptoms worsen or if the condition fails to improve as anticipated.  The above assessment and management plan was discussed with the patient. The patient verbalized understanding of and has agreed to the management plan. Patient is aware to call the clinic if symptoms persist or worsen. Patient is aware when to return to the clinic for a follow-up visit. Patient educated on when it is appropriate to go to the emergency department.   Time call ended:  1300  I provided 12 minutes of  non face-to-face  time during this encounter.    Gabriel Earing, FNP

## 2022-04-13 ENCOUNTER — Other Ambulatory Visit: Payer: Self-pay

## 2022-04-13 DIAGNOSIS — J069 Acute upper respiratory infection, unspecified: Secondary | ICD-10-CM | POA: Diagnosis not present

## 2022-04-13 LAB — VERITOR FLU A/B WAIVED
Influenza A: NEGATIVE
Influenza B: NEGATIVE

## 2022-04-14 LAB — NOVEL CORONAVIRUS, NAA: SARS-CoV-2, NAA: NOT DETECTED

## 2022-04-17 ENCOUNTER — Telehealth (INDEPENDENT_AMBULATORY_CARE_PROVIDER_SITE_OTHER): Payer: Medicaid Other | Admitting: Family Medicine

## 2022-04-17 ENCOUNTER — Encounter: Payer: Self-pay | Admitting: Family Medicine

## 2022-04-17 DIAGNOSIS — J208 Acute bronchitis due to other specified organisms: Secondary | ICD-10-CM

## 2022-04-17 DIAGNOSIS — B9689 Other specified bacterial agents as the cause of diseases classified elsewhere: Secondary | ICD-10-CM | POA: Diagnosis not present

## 2022-04-17 MED ORDER — PREDNISONE 20 MG PO TABS: ORAL_TABLET | ORAL | 0 refills | Status: DC

## 2022-04-17 MED ORDER — PROMETHAZINE-DM 6.25-15 MG/5ML PO SYRP: 2.5000 mL | ORAL_SOLUTION | Freq: Four times a day (QID) | ORAL | 0 refills | Status: DC | PRN

## 2022-04-17 MED ORDER — DOXYCYCLINE HYCLATE 100 MG PO TABS: 100.0000 mg | ORAL_TABLET | Freq: Two times a day (BID) | ORAL | 0 refills | Status: AC

## 2022-04-17 MED ORDER — BENZONATATE 100 MG PO CAPS: 100.0000 mg | ORAL_CAPSULE | Freq: Three times a day (TID) | ORAL | 0 refills | Status: DC | PRN

## 2022-04-17 NOTE — Progress Notes (Signed)
Telephone visit  Subjective: CC:URI PCP: Dettinger, Elige Radon, MD HAL:[id number] Jeff Walker is a 56 y.o. male calls for telephone consult today. Patient provides verbal consent for consult held via phone.  Due to COVID-19 pandemic this visit was conducted virtually. This visit type was conducted due to national recommendations for restrictions regarding the COVID-19 Pandemic (e.g. social distancing, sheltering in place) in an effort to limit this patient's exposure and mitigate transmission in our community. All issues noted in this document were discussed and addressed.  A physical exam was not performed with this format.   Location of patient: home Location of provider: [facility] Others present for call: none  1. Bronchitis Patient reports ongoing cough, congestion, post nasal drainage.  He reports wheezing and rattling in chest.  Using albuterol prn.  No prescribed cough medications.  No hemoptysis or brown sputum.  Subjective fevers at bedtime. Having chills.  ROS: Per HPI  Social History   Socioeconomic History   Marital status: Single    Spouse name: Not on file   Number of children: Not on file   Years of education: Not on file   Highest education level: Not on file  Occupational History   Not on file  Tobacco Use   Smoking status: Every Day    Packs/day: 1.00    Years: 35.00    Total pack years: 35.00    Types: Cigarettes   Smokeless tobacco: Never  Vaping Use   Vaping Use: Former  Substance and Sexual Activity   Alcohol use: Yes    Alcohol/week: 6.0 standard drinks of alcohol    Types: 6 Cans of beer per week    Comment: 1-2 beers per day  (makes his own beer)    Drug use: No   Sexual activity: Yes    Birth control/protection: Condom  Other Topics Concern   Not on file  Social History Narrative   Not on file   Social Determinants of Health   Financial Resource Strain: Not on file  Food Insecurity: Not on file  Transportation Needs: Not on file  Physical Activity:  Not on file  Stress: Not on file  Social Connections: Not on file     No Known Allergies Past Medical History:  Diagnosis Date   Depression    Environmental allergies    Hypertension    Shingles     Current Outpatient Medications:    albuterol (VENTOLIN HFA) 108 (90 Base) MCG/ACT inhaler, Inhale 2 puffs into the lungs every 6 (six) hours as needed for wheezing or shortness of breath., Disp: 8 g, Rfl: 2   amLODipine (NORVASC) 10 MG tablet, Take 1 tablet (10 mg total) by mouth daily., Disp: 90 tablet, Rfl: 3   cyclobenzaprine (FLEXERIL) 10 MG tablet, Take 1 tablet (10 mg total) by mouth 2 (two) times daily as needed for muscle spasms., Disp: 30 tablet, Rfl: 0   hydrochlorothiazide (HYDRODIURIL) 25 MG tablet, Take 1 tablet (25 mg total) by mouth daily., Disp: 90 tablet, Rfl: 3  Assessment/ Plan: 56 y.o. male   Acute bacterial bronchitis - Plan: predniSONE (DELTASONE) 20 MG tablet, benzonatate (TESSALON PERLES) 100 MG capsule, promethazine-dextromethorphan (PROMETHAZINE-DM) 6.25-15 MG/5ML syrup, doxycycline (VIBRA-TABS) 100 MG tablet  Going to empirically treat for acute bacterial bronchitis given former smoking status cannot rule out an undiagnosed COPD possible exacerbation given productive cough, wheezing.  Doxycycline sent, prednisone burst sent and cough medications have been sent.  Discussed red flag signs and symptoms warranting further evaluation.  Patient was good  understanding will follow-up as needed  Start time: 12:29pm End time: 12:34pm  Total time spent on patient care (including telephone call/ virtual visit): 5 minutes  Lucillie Kiesel Hulen Skains, DO [facility] [phone]

## 2022-04-25 ENCOUNTER — Encounter: Payer: Self-pay | Admitting: Family Medicine

## 2022-04-25 ENCOUNTER — Ambulatory Visit (INDEPENDENT_AMBULATORY_CARE_PROVIDER_SITE_OTHER): Payer: Medicaid Other | Admitting: Family Medicine

## 2022-04-25 VITALS — BP 146/85 | HR 95 | Temp 97.2°F | Ht 72.0 in | Wt 180.0 lb

## 2022-04-25 DIAGNOSIS — J441 Chronic obstructive pulmonary disease with (acute) exacerbation: Secondary | ICD-10-CM

## 2022-04-25 MED ORDER — DEXAMETHASONE 2 MG PO TABS: ORAL_TABLET | ORAL | 0 refills | Status: DC

## 2022-04-25 MED ORDER — METHYLPREDNISOLONE ACETATE 80 MG/ML IJ SUSP
80.0000 mg | Freq: Once | INTRAMUSCULAR | Status: AC
  Administered 2022-04-25: 80 mg via INTRAMUSCULAR

## 2022-04-25 MED ORDER — CEFTRIAXONE SODIUM 1 G IJ SOLR
1.0000 g | Freq: Once | INTRAMUSCULAR | Status: AC
  Administered 2022-04-25: 1 g via INTRAMUSCULAR

## 2022-04-25 MED ORDER — AMOXICILLIN-POT CLAVULANATE 875-125 MG PO TABS: 1.0000 | ORAL_TABLET | Freq: Two times a day (BID) | ORAL | 0 refills | Status: DC

## 2022-04-25 NOTE — Progress Notes (Signed)
BP (!) 146/85   Pulse 95   Temp (!) 97.2 F (36.2 C)   Ht 6' (1.829 m)   Wt 180 lb (81.6 kg)   SpO2 98%   BMI 24.41 kg/m    Subjective:   Patient ID: Jeff Walker, male    DOB: [birth year], 56 y.o.   MRN: [id number]  HPI: Jeff Walker is a 56 y.o. male presenting on 04/25/2022 for Cough and Shortness of Breath   HPI Cough and congestion and shortness of breath and wheezing Patient is coming in complaining of 2 weeks of cough and congestion and shortness of breath and wheezing.  He was treated with some prednisone for COPD exacerbation and given some cough medicine and he says it is just not working.  He says he tried some other cough syrups but they just are not helping.  He just is not getting any better and he feels short of breath and wheezy.   Relevant past medical, surgical, family and social history reviewed and updated as indicated. Interim medical history since our last visit reviewed. Allergies and medications reviewed and updated.  Review of Systems  Constitutional:  Negative for chills and fever.  HENT:  Positive for congestion and postnasal drip. Negative for ear discharge, ear pain, rhinorrhea, sinus pressure, sneezing, sore throat and voice change.   Eyes:  Negative for pain, discharge, redness and visual disturbance.  Respiratory:  Positive for cough, shortness of breath and wheezing.   Cardiovascular:  Negative for chest pain and leg swelling.  Musculoskeletal:  Negative for gait problem and myalgias.  Skin:  Negative for rash.  All other systems reviewed and are negative.   Per HPI unless specifically indicated above   Allergies as of 04/25/2022   No Known Allergies      Medication List        Accurate as of April 25, 2022  2:01 PM. If you have any questions, ask your nurse or doctor.          STOP taking these medications    benzonatate 100 MG capsule Commonly known as: Lawyer Stopped by: Nils Pyle, MD    predniSONE 20 MG tablet Commonly known as: DELTASONE Stopped by: Nils Pyle, MD   promethazine-dextromethorphan 6.25-15 MG/5ML syrup Commonly known as: PROMETHAZINE-DM Stopped by: Nils Pyle, MD       TAKE these medications    albuterol 108 (90 Base) MCG/ACT inhaler Commonly known as: VENTOLIN HFA Inhale 2 puffs into the lungs every 6 (six) hours as needed for wheezing or shortness of breath.   amLODipine 10 MG tablet Commonly known as: NORVASC Take 1 tablet (10 mg total) by mouth daily.   amoxicillin-clavulanate 875-125 MG tablet Commonly known as: AUGMENTIN Take 1 tablet by mouth 2 (two) times daily. Started by: Nils Pyle, MD   cyclobenzaprine 10 MG tablet Commonly known as: FLEXERIL Take 1 tablet (10 mg total) by mouth 2 (two) times daily as needed for muscle spasms.   dexamethasone 2 MG tablet Commonly known as: DECADRON Take 4 tablets for 3 days then 3 tablets for 3 days then 2 tablets for 3 days then 1 tablet for 3 days Started by: Nils Pyle, MD   hydrochlorothiazide 25 MG tablet Commonly known as: HYDRODIURIL Take 1 tablet (25 mg total) by mouth daily.         Objective:   BP (!) 146/85   Pulse 95   Temp (!) 97.2 F (36.2 C)  Ht 6' (1.829 m)   Wt 180 lb (81.6 kg)   SpO2 98%   BMI 24.41 kg/m   Wt Readings from Last 3 Encounters:  04/25/22 180 lb (81.6 kg)  03/13/22 172 lb (78 kg)  03/12/22 172 lb (78 kg)    Physical Exam Vitals and nursing note reviewed.  Constitutional:      General: He is not in acute distress.    Appearance: He is well-developed. He is not diaphoretic.  Eyes:     General: No scleral icterus.    Conjunctiva/sclera: Conjunctivae normal.  Neck:     Thyroid: No thyromegaly.  Cardiovascular:     Rate and Rhythm: Normal rate and regular rhythm.     Heart sounds: Normal heart sounds. No murmur heard. Pulmonary:     Effort: Pulmonary effort is normal. No respiratory distress.     Breath  sounds: Wheezing and rhonchi present. No rales.  Chest:     Chest wall: No tenderness.  Musculoskeletal:        General: Normal range of motion.     Cervical back: Neck supple.  Lymphadenopathy:     Cervical: No cervical adenopathy.  Skin:    General: Skin is warm and dry.     Findings: No rash.  Neurological:     Mental Status: He is alert and oriented to person, place, and time.     Coordination: Coordination normal.  Psychiatric:        Behavior: Behavior normal.       Assessment & Plan:   Problem List Items Addressed This Visit   None Visit Diagnoses     COPD exacerbation ([place])    -  Primary   Relevant Medications   cefTRIAXone (ROCEPHIN) injection 1 g (Start on 04/25/2022  2:15 PM)   methylPREDNISolone acetate (DEPO-MEDROL) injection 80 mg (Start on 04/25/2022  2:15 PM)   dexamethasone (DECADRON) 2 MG tablet   amoxicillin-clavulanate (AUGMENTIN) 875-125 MG tablet     Will give Rocephin and Depo 80 IM. Will also give Augmentin and dexamethasone If not improved from this then he may need to go to the emergency department Follow up plan: Return if symptoms worsen or fail to improve.  Counseling provided for all of the vaccine components No orders of the defined types were placed in this encounter.   Caryl Pina, MD [facility] 04/25/2022, 2:01 PM

## 2022-06-01 ENCOUNTER — Ambulatory Visit: Payer: Medicaid Other | Admitting: Family

## 2022-06-04 ENCOUNTER — Ambulatory Visit: Payer: Medicaid Other | Admitting: Family

## 2022-06-04 ENCOUNTER — Ambulatory Visit: Payer: Medicaid Other | Admitting: Family Medicine

## 2022-07-12 ENCOUNTER — Encounter: Payer: Self-pay | Admitting: Radiology

## 2023-08-01 ENCOUNTER — Ambulatory Visit: Payer: Self-pay

## 2023-08-01 NOTE — Telephone Encounter (Signed)
 Chief Complaint: Numbness Symptoms: lightheaded Frequency: About 1 week Pertinent Negatives: Patient denies CP, SOB Disposition: [] ED /[] Urgent Care (no appt availability in office) / [x] Appointment(In office/virtual)/ []  [facility]/ [] Home Care/ [] Refused Recommended Disposition /[]  Mobile Bus/ []  Follow-up with PCP Additional Notes: Pt reports he passed out about 1 week ago at the grocery store, EMS was called, he was advised his BP was "very high" but does not recall the value but states his BP is "always high" because he is out of medication, declines refill and reports he would rather wait to discuss that with PCP at next visit. Pt notes he did not go to ED, reports vision changes for about 2 days following the episode which have since resolved. Denies weakness, current vision changes, tingling, changes in speech, CP or SOB. Pt requesting Tuesday appt due to transportation, pt advised it is recommended he be seen today. Pt requesting Tuesday appt with Dr. Louanne Skye. Routing to clinic for follow up. This RN educated pt on home care, new-worsening symptoms, when to call back/seek emergent care. Pt verbalized understanding and agrees to plan.    Copied from CRM [id number]. Topic: Clinical - Red Word Triage >> Aug 01, 2023  1:59 PM DeAngela L wrote: Red Word that prompted transfer to Nurse Triage: Pt passed out the other day and his heart rate was high and his blood pressure was high Patient is experiencing numbness in his feet and lower back, calling to schedule appt for next Tuesday to see his PCP but no appt available Reason for Disposition  Back pain (and neurologic deficit)  Answer Assessment - Initial Assessment Questions 1. SYMPTOM: "What is the main symptom you are concerned about?" (e.g., weakness, numbness)     Numbness 2. ONSET: "When did this start?" (minutes, hours, days; while sleeping)     1 week ago pt passed out and symptoms started after 3. LAST NORMAL:  "When was the last time you (the patient) were normal (no symptoms)?"     Prior to passing out 4. PATTERN "Does this come and go, or has it been constant since it started?"  "Is it present now?"     Constant 5. CARDIAC SYMPTOMS: "Have you had any of the following symptoms: chest pain, difficulty breathing, palpitations?"     None 6. NEUROLOGIC SYMPTOMS: "Have you had any of the following symptoms: headache, dizziness, vision loss, double vision, changes in speech, unsteady on your feet?"     Passed out 1 week ago, vision changes after he passed out, resolved in 2 days 7. OTHER SYMPTOMS: "Do you have any other symptoms?"     lightheaded  Protocols used: Neurologic Deficit-A-AH

## 2023-08-02 ENCOUNTER — Telehealth: Payer: Self-pay | Admitting: Family Medicine

## 2023-08-02 NOTE — Telephone Encounter (Signed)
 Copied from CRM [id number]. Topic: General - Other >> Aug 02, 2023  1:32 PM Jeff Walker wrote: Reason for CRM: Please call patient back as he stated he needed an appointment for Tuesday and then told me that he had a message on his mychart that said something about being seen in office within 4 hours. Thank you

## 2023-08-02 NOTE — Telephone Encounter (Signed)
 Refer to phone message from today 08/02/23

## 2023-08-02 NOTE — Telephone Encounter (Signed)
 Taken care if in another encounter  for the same issues

## 2023-08-02 NOTE — Telephone Encounter (Signed)
 Copied from CRM [id number]. Topic: General - Call Back - No Documentation >> Aug 01, 2023  5:50 PM DeAngela L wrote: Reason for CRM: Patient returning call after speaking with nurse triage, he is waiting for the Dr office to call him back and schedule an appt after speaking with Dr call back [phone]

## 2023-08-02 NOTE — Telephone Encounter (Signed)
 Spoke with pt. States that he has an appt next Tues.

## 2023-08-06 ENCOUNTER — Encounter: Payer: Self-pay | Admitting: Nurse Practitioner

## 2023-08-06 ENCOUNTER — Ambulatory Visit (INDEPENDENT_AMBULATORY_CARE_PROVIDER_SITE_OTHER): Admitting: Nurse Practitioner

## 2023-08-06 VITALS — BP 199/109 | HR 99 | Temp 98.3°F | Ht 72.0 in | Wt 190.0 lb

## 2023-08-06 DIAGNOSIS — R55 Syncope and collapse: Secondary | ICD-10-CM | POA: Diagnosis not present

## 2023-08-06 DIAGNOSIS — M5441 Lumbago with sciatica, right side: Secondary | ICD-10-CM

## 2023-08-06 DIAGNOSIS — M5442 Lumbago with sciatica, left side: Secondary | ICD-10-CM

## 2023-08-06 DIAGNOSIS — I10 Essential (primary) hypertension: Secondary | ICD-10-CM | POA: Diagnosis not present

## 2023-08-06 MED ORDER — METHYLPREDNISOLONE ACETATE 80 MG/ML IJ SUSP
80.0000 mg | Freq: Once | INTRAMUSCULAR | Status: AC
  Administered 2023-08-06: 80 mg via INTRAMUSCULAR

## 2023-08-06 MED ORDER — PREDNISONE 20 MG PO TABS: ORAL_TABLET | ORAL | 0 refills | Status: DC

## 2023-08-06 MED ORDER — KETOROLAC TROMETHAMINE 60 MG/2ML IM SOLN
60.0000 mg | Freq: Once | INTRAMUSCULAR | Status: AC
  Administered 2023-08-06: 60 mg via INTRAMUSCULAR

## 2023-08-06 MED ORDER — AMLODIPINE BESYLATE 10 MG PO TABS: 10.0000 mg | ORAL_TABLET | Freq: Every day | ORAL | 3 refills | Status: DC

## 2023-08-06 MED ORDER — LISINOPRIL-HYDROCHLOROTHIAZIDE 20-12.5 MG PO TABS: 1.0000 | ORAL_TABLET | Freq: Every day | ORAL | 3 refills | Status: AC

## 2023-08-06 NOTE — Progress Notes (Addendum)
 Subjective:    Patient ID: Jeff Walker, male    DOB: 1965/05/25, 58 y.o.   MRN: [id number]   Chief Complaint: Jeff Walker out in grocery store   Patient passed out at grocery store. EMS was called and they checked him out and said he did not need  to go to the ED. His blood pressure was really high and they recommend that he see PCP. He use to be on blood pressure meds but has not had in several months.  * according to chart he was on hydrochlorothiazide 12.5 and amlodipine 10mg   daily.  Since fall his back has been hurting and both feet feel numb. Walking with cane. Rates back pain 2-3/10. Radiates down both legs. Apinful to walk on or lie down.    Patient Active Problem List   Diagnosis Date Noted   Anxiety 09/10/2017   Tobacco use 09/10/2017   Dysphagia 08/27/2017   Depression, recurrent (HCC) 08/21/2017   Hypertension 09/20/2016       Review of Systems  Constitutional:  Negative for diaphoresis.  Eyes:  Negative for pain.  Respiratory:  Negative for shortness of breath.   Cardiovascular:  Negative for chest pain, palpitations and leg swelling.  Gastrointestinal:  Negative for abdominal pain.  Endocrine: Negative for polydipsia.  Skin:  Negative for rash.  Neurological:  Positive for syncope. Negative for dizziness, weakness and headaches.  Hematological:  Does not bruise/bleed easily.  All other systems reviewed and are negative.      Objective:   Physical Exam Constitutional:      Appearance: Normal appearance.  Cardiovascular:     Rate and Rhythm: Normal rate and regular rhythm.     Heart sounds: Normal heart sounds.  Pulmonary:     Effort: Pulmonary effort is normal.     Breath sounds: Normal breath sounds.  Musculoskeletal:     Comments: FROM lumbar spine with pain on flexion and extension (-) SLR bil Motor strength and sensation distally intact Ambulating with cane  Skin:    General: Skin is warm.  Neurological:     General: No focal deficit  present.     Mental Status: He is alert and oriented to person, place, and time.     Cranial Nerves: No cranial nerve deficit.     Sensory: No sensory deficit.  Psychiatric:        Mood and Affect: Mood normal.        Behavior: Behavior normal.     BP (!) 199/109   Pulse 99   Temp 98.3 F (36.8 C) (Temporal)   Ht 6' (1.829 m)   Wt 190 lb (86.2 kg)   SpO2 99%   BMI 25.77 kg/m        Assessment & Plan:   Jeff Walker in today with chief complaint of Passed out in grocery store   1. Primary hypertension (Primary) Low sodium diet Keep diary of blood pressure at home and bring to next appointmnet - CBC with Differential/Platelet - CMP14+EGFR - lisinopril-hydrochlorothiazide (ZESTORETIC) 20-12.5 MG tablet; Take 1 tablet by mouth daily.  Dispense: 90 tablet; Refill: 3 - amLODipine (NORVASC) 10 MG tablet; Take 1 tablet (10 mg total) by mouth daily.  Dispense: 90 tablet; Refill: 3  2. Syncope, unspecified syncope type Report any new episodes Go to ED if reoccurs  3. Acute midline low back pain with bilateral sciatica Moist heat rest - ketorolac (TORADOL) injection 60 mg - methylPREDNISolone acetate (DEPO-MEDROL) injection 80 mg - predniSONE (DELTASONE)  20 MG tablet; 2 po at sametime daily for 5 days-  Dispense: 10 tablet; Refill: 0    The above assessment and management plan was discussed with the patient. The patient verbalized understanding of and has agreed to the management plan. Patient is aware to call the clinic if symptoms persist or worsen. Patient is aware when to return to the clinic for a follow-up visit. Patient educated on when it is appropriate to go to the emergency department.   Mary-Margaret Daphine Deutscher, FNP

## 2023-08-06 NOTE — Patient Instructions (Signed)
 Acute Back Pain, Adult Acute back pain is sudden and usually short-lived. It is often caused by an injury to the muscles and tissues in the back. The injury may result from: A muscle, tendon, or ligament getting overstretched or torn. Ligaments are tissues that connect bones to each other. Lifting something improperly can cause a back strain. Wear and tear (degeneration) of the spinal disks. Spinal disks are circular tissue that provide cushioning between the bones of the spine (vertebrae). Twisting motions, such as while playing sports or doing yard work. A hit to the back. Arthritis. You may have a physical exam, lab tests, and imaging tests to find the cause of your pain. Acute back pain usually goes away with rest and home care. Follow these instructions at home: Managing pain, stiffness, and swelling Take over-the-counter and prescription medicines only as told by your health care provider. Treatment may include medicines for pain and inflammation that are taken by mouth or applied to the skin, or muscle relaxants. Your health care provider may recommend applying ice during the first 24-48 hours after your pain starts. To do this: Put ice in a plastic bag. Place a towel between your skin and the bag. Leave the ice on for 20 minutes, 2-3 times a day. Remove the ice if your skin turns bright red. This is very important. If you cannot feel pain, heat, or cold, you have a greater risk of damage to the area. If directed, apply heat to the affected area as often as told by your health care provider. Use the heat source that your health care provider recommends, such as a moist heat pack or a heating pad. Place a towel between your skin and the heat source. Leave the heat on for 20-30 minutes. Remove the heat if your skin turns bright red. This is especially important if you are unable to feel pain, heat, or cold. You have a greater risk of getting burned. Activity  Do not stay in bed. Staying in  bed for more than 1-2 days can delay your recovery. Sit up and stand up straight. Avoid leaning forward when you sit or hunching over when you stand. If you work at a desk, sit close to it so you do not need to lean over. Keep your chin tucked in. Keep your neck drawn back, and keep your elbows bent at a 90-degree angle (right angle). Sit high and close to the steering wheel when you drive. Add lower back (lumbar) support to your car seat, if needed. Take short walks on even surfaces as soon as you are able. Try to increase the length of time you walk each day. Do not sit, drive, or stand in one place for more than 30 minutes at a time. Sitting or standing for long periods of time can put stress on your back. Do not drive or use heavy machinery while taking prescription pain medicine. Use proper lifting techniques. When you bend and lift, use positions that put less stress on your back: [place] your knees. Keep the load close to your body. Avoid twisting. Exercise regularly as told by your health care provider. Exercising helps your back heal faster and helps prevent back injuries by keeping muscles strong and flexible. Work with a physical therapist to make a safe exercise program, as recommended by your health care provider. Do any exercises as told by your physical therapist. Lifestyle Maintain a healthy weight. Extra weight puts stress on your back and makes it difficult to have good  posture. Avoid activities or situations that make you feel anxious or stressed. Stress and anxiety increase muscle tension and can make back pain worse. Learn ways to manage anxiety and stress, such as through exercise. General instructions Sleep on a firm mattress in a comfortable position. Try lying on your side with your knees slightly bent. If you lie on your back, put a pillow under your knees. Keep your head and neck in a straight line with your spine (neutral position) when using electronic equipment like  smartphones or pads. To do this: Raise your smartphone or pad to look at it instead of bending your head or neck to look down. Put the smartphone or pad at the level of your face while looking at the screen. Follow your treatment plan as told by your health care provider. This may include: Cognitive or behavioral therapy. Acupuncture or massage therapy. Meditation or yoga. Contact a health care provider if: You have pain that is not relieved with rest or medicine. You have increasing pain going down into your legs or buttocks. Your pain does not improve after 2 weeks. You have pain at night. You lose weight without trying. You have a fever or chills. You develop nausea or vomiting. You develop abdominal pain. Get help right away if: You develop new bowel or bladder control problems. You have unusual weakness or numbness in your arms or legs. You feel faint. These symptoms may represent a serious problem that is an emergency. Do not wait to see if the symptoms will go away. Get medical help right away. Call your local emergency services (911 in the [place] Do not drive yourself to the hospital. Summary Acute back pain is sudden and usually short-lived. Use proper lifting techniques. When you bend and lift, use positions that put less stress on your back. Take over-the-counter and prescription medicines only as told by your health care provider, and apply heat or ice as told. This information is not intended to replace advice given to you by your health care provider. Make sure you discuss any questions you have with your health care provider. Document Revised: 07/22/2020 Document Reviewed: 07/22/2020 [employer] Patient Education  2024 [employer].

## 2023-08-07 LAB — CBC WITH DIFFERENTIAL/PLATELET
Basophils Absolute: 0.1 10*3/uL (ref 0.0–0.2)
Basos: 1 %
EOS (ABSOLUTE): 0.3 10*3/uL (ref 0.0–0.4)
Eos: 3 %
Hematocrit: 50 % (ref 37.5–51.0)
Hemoglobin: 16.1 g/dL (ref 13.0–17.7)
Immature Grans (Abs): 0 10*3/uL (ref 0.0–0.1)
Immature Granulocytes: 0 %
Lymphocytes Absolute: 3.7 10*3/uL — ABNORMAL HIGH (ref 0.7–3.1)
Lymphs: 31 %
MCH: 29.2 pg (ref 26.6–33.0)
MCHC: 32.2 g/dL (ref 31.5–35.7)
MCV: 91 fL (ref 79–97)
Monocytes Absolute: 0.8 10*3/uL (ref 0.1–0.9)
Monocytes: 7 %
Neutrophils Absolute: 7.1 10*3/uL — ABNORMAL HIGH (ref 1.4–7.0)
Neutrophils: 58 %
Platelets: 334 10*3/uL (ref 150–450)
RBC: 5.52 x10E6/uL (ref 4.14–5.80)
RDW: 13.2 % (ref 11.6–15.4)
WBC: 12 10*3/uL — ABNORMAL HIGH (ref 3.4–10.8)

## 2023-08-07 LAB — CMP14+EGFR
ALT: 8 IU/L (ref 0–44)
AST: 11 IU/L (ref 0–40)
Albumin: 4 g/dL (ref 3.8–4.9)
Alkaline Phosphatase: 104 IU/L (ref 44–121)
BUN/Creatinine Ratio: 6 — ABNORMAL LOW (ref 9–20)
BUN: 9 mg/dL (ref 6–24)
Bilirubin Total: 0.3 mg/dL (ref 0.0–1.2)
CO2: 26 mmol/L (ref 20–29)
Calcium: 9.2 mg/dL (ref 8.7–10.2)
Chloride: 100 mmol/L (ref 96–106)
Creatinine, Ser: 1.41 mg/dL — ABNORMAL HIGH (ref 0.76–1.27)
Globulin, Total: 2.7 g/dL (ref 1.5–4.5)
Glucose: 90 mg/dL (ref 70–99)
Potassium: 3.9 mmol/L (ref 3.5–5.2)
Sodium: 142 mmol/L (ref 134–144)
Total Protein: 6.7 g/dL (ref 6.0–8.5)
eGFR: 58 mL/min/{1.73_m2} — ABNORMAL LOW (ref >59)

## 2023-09-13 ENCOUNTER — Ambulatory Visit: Admitting: Family Medicine

## 2023-10-03 ENCOUNTER — Observation Stay (HOSPITAL_COMMUNITY)
Admission: EM | Admit: 2023-10-03 | Discharge: 2023-10-05 | Disposition: A | Discharge status: Home / Self Care | Attending: Family Medicine | Admitting: Family Medicine

## 2023-10-03 ENCOUNTER — Emergency Department (HOSPITAL_COMMUNITY)

## 2023-10-03 ENCOUNTER — Encounter: Payer: Self-pay | Admitting: Family Medicine

## 2023-10-03 ENCOUNTER — Other Ambulatory Visit: Payer: Self-pay

## 2023-10-03 ENCOUNTER — Ambulatory Visit: Payer: Self-pay

## 2023-10-03 ENCOUNTER — Ambulatory Visit: Admitting: Family Medicine

## 2023-10-03 ENCOUNTER — Ambulatory Visit (HOSPITAL_COMMUNITY)
Admission: RE | Admit: 2023-10-03 | Discharge: 2023-10-03 | Disposition: A | Discharge status: Home / Self Care | Source: Ambulatory Visit | Attending: Family Medicine | Admitting: Family Medicine

## 2023-10-03 ENCOUNTER — Encounter (HOSPITAL_COMMUNITY): Payer: Self-pay

## 2023-10-03 VITALS — BP 116/72 | Ht 72.0 in | Wt 186.0 lb

## 2023-10-03 DIAGNOSIS — G319 Degenerative disease of nervous system, unspecified: Secondary | ICD-10-CM | POA: Diagnosis not present

## 2023-10-03 DIAGNOSIS — I63532 Cerebral infarction due to unspecified occlusion or stenosis of left posterior cerebral artery: Secondary | ICD-10-CM | POA: Diagnosis not present

## 2023-10-03 DIAGNOSIS — I6523 Occlusion and stenosis of bilateral carotid arteries: Secondary | ICD-10-CM | POA: Diagnosis not present

## 2023-10-03 DIAGNOSIS — I10 Essential (primary) hypertension: Secondary | ICD-10-CM

## 2023-10-03 DIAGNOSIS — R200 Anesthesia of skin: Secondary | ICD-10-CM

## 2023-10-03 DIAGNOSIS — I639 Cerebral infarction, unspecified: Secondary | ICD-10-CM | POA: Diagnosis not present

## 2023-10-03 DIAGNOSIS — F17210 Nicotine dependence, cigarettes, uncomplicated: Secondary | ICD-10-CM | POA: Insufficient documentation

## 2023-10-03 DIAGNOSIS — F339 Major depressive disorder, recurrent, unspecified: Secondary | ICD-10-CM | POA: Diagnosis present

## 2023-10-03 DIAGNOSIS — Z79899 Other long term (current) drug therapy: Secondary | ICD-10-CM | POA: Diagnosis not present

## 2023-10-03 DIAGNOSIS — I6623 Occlusion and stenosis of bilateral posterior cerebral arteries: Secondary | ICD-10-CM | POA: Diagnosis not present

## 2023-10-03 DIAGNOSIS — H539 Unspecified visual disturbance: Secondary | ICD-10-CM | POA: Diagnosis not present

## 2023-10-03 DIAGNOSIS — Z720 Tobacco use: Secondary | ICD-10-CM | POA: Diagnosis present

## 2023-10-03 DIAGNOSIS — I6782 Cerebral ischemia: Secondary | ICD-10-CM | POA: Diagnosis not present

## 2023-10-03 LAB — CBC WITH DIFFERENTIAL/PLATELET
Abs Immature Granulocytes: 0.03 10*3/uL (ref 0.00–0.07)
Basophils Absolute: 0.1 10*3/uL (ref 0.0–0.1)
Basophils Relative: 1 %
Eosinophils Absolute: 0.2 10*3/uL (ref 0.0–0.5)
Eosinophils Relative: 2 %
HCT: 43.4 % (ref 39.0–52.0)
Hemoglobin: 15 g/dL (ref 13.0–17.0)
Immature Granulocytes: 0 %
Lymphocytes Relative: 32 %
Lymphs Abs: 2.5 10*3/uL (ref 0.7–4.0)
MCH: 30.3 pg (ref 26.0–34.0)
MCHC: 34.6 g/dL (ref 30.0–36.0)
MCV: 87.7 fL (ref 80.0–100.0)
Monocytes Absolute: 0.5 10*3/uL (ref 0.1–1.0)
Monocytes Relative: 7 %
Neutro Abs: 4.7 10*3/uL (ref 1.7–7.7)
Neutrophils Relative %: 58 %
Platelets: 316 10*3/uL (ref 150–400)
RBC: 4.95 MIL/uL (ref 4.22–5.81)
RDW: 13.3 % (ref 11.5–15.5)
WBC: 8 10*3/uL (ref 4.0–10.5)
nRBC: 0 % (ref 0.0–0.2)

## 2023-10-03 LAB — COMPREHENSIVE METABOLIC PANEL WITH GFR
ALT: 12 U/L (ref 0–44)
ALT: 13 U/L (ref 0–44)
AST: 15 U/L (ref 15–41)
AST: 12 U/L — ABNORMAL LOW (ref 15–41)
Albumin: 3.7 g/dL (ref 3.5–5.0)
Albumin: 3.5 g/dL (ref 3.5–5.0)
Alkaline Phosphatase: 76 U/L (ref 38–126)
Alkaline Phosphatase: 70 U/L (ref 38–126)
Anion gap: 11 (ref 5–15)
Anion gap: 11 (ref 5–15)
BUN: 17 mg/dL (ref 6–20)
BUN: 17 mg/dL (ref 6–20)
CO2: 24 mmol/L (ref 22–32)
CO2: 21 mmol/L — ABNORMAL LOW (ref 22–32)
Calcium: 9.1 mg/dL (ref 8.9–10.3)
Calcium: 8.9 mg/dL (ref 8.9–10.3)
Chloride: 98 mmol/L (ref 98–111)
Chloride: 101 mmol/L (ref 98–111)
Creatinine, Ser: 1.59 mg/dL — ABNORMAL HIGH (ref 0.61–1.24)
Creatinine, Ser: 1.45 mg/dL — ABNORMAL HIGH (ref 0.61–1.24)
GFR, Estimated: 50 mL/min — ABNORMAL LOW (ref >60)
GFR, Estimated: 56 mL/min — ABNORMAL LOW (ref >60)
Glucose, Bld: 96 mg/dL (ref 70–99)
Glucose, Bld: 98 mg/dL (ref 70–99)
Potassium: 3.9 mmol/L (ref 3.5–5.1)
Potassium: 3.7 mmol/L (ref 3.5–5.1)
Sodium: 133 mmol/L — ABNORMAL LOW (ref 135–145)
Sodium: 133 mmol/L — ABNORMAL LOW (ref 135–145)
Total Bilirubin: 0.6 mg/dL (ref 0.0–1.2)
Total Bilirubin: 0.7 mg/dL (ref 0.0–1.2)
Total Protein: 7.4 g/dL (ref 6.5–8.1)
Total Protein: 7.1 g/dL (ref 6.5–8.1)

## 2023-10-03 LAB — PROTIME-INR
INR: 1.1 (ref 0.8–1.2)
Prothrombin Time: 14 s (ref 11.4–15.2)

## 2023-10-03 MED ORDER — GADOBUTROL 1 MMOL/ML IV SOLN
8.5000 mL | Freq: Once | INTRAVENOUS | Status: AC | PRN
  Administered 2023-10-03: 8.5 mL via INTRAVENOUS

## 2023-10-03 MED ORDER — HEPARIN SODIUM (PORCINE) 5000 UNIT/ML IJ SOLN
5000.0000 [IU] | Freq: Three times a day (TID) | INTRAMUSCULAR | Status: DC
  Administered 2023-10-03 – 2023-10-04 (×4): 5000 [IU] via SUBCUTANEOUS
  Filled 2023-10-03 (×5): qty 1

## 2023-10-03 MED ORDER — SENNOSIDES-DOCUSATE SODIUM 8.6-50 MG PO TABS: 1.0000 | ORAL_TABLET | Freq: Every evening | ORAL | Status: DC | PRN

## 2023-10-03 MED ORDER — CLOPIDOGREL BISULFATE 75 MG PO TABS
300.0000 mg | ORAL_TABLET | Freq: Once | ORAL | Status: AC
  Administered 2023-10-03: 300 mg via ORAL
  Filled 2023-10-03: qty 4

## 2023-10-03 MED ORDER — ACETAMINOPHEN 160 MG/5ML PO SOLN: 650.0000 mg | ORAL | Status: DC | PRN

## 2023-10-03 MED ORDER — STROKE: EARLY STAGES OF RECOVERY BOOK
Freq: Once | Status: DC
  Filled 2023-10-03: qty 1

## 2023-10-03 MED ORDER — ASPIRIN 81 MG PO TBEC
81.0000 mg | DELAYED_RELEASE_TABLET | Freq: Every day | ORAL | Status: DC
  Administered 2023-10-04 – 2023-10-05 (×2): 81 mg via ORAL
  Filled 2023-10-03 (×2): qty 1

## 2023-10-03 MED ORDER — ACETAMINOPHEN 325 MG PO TABS
650.0000 mg | ORAL_TABLET | ORAL | Status: DC | PRN
  Administered 2023-10-04: 650 mg via ORAL
  Filled 2023-10-03: qty 2

## 2023-10-03 MED ORDER — NICOTINE 21 MG/24HR TD PT24
21.0000 mg | MEDICATED_PATCH | Freq: Every day | TRANSDERMAL | Status: DC
  Administered 2023-10-05: 21 mg via TRANSDERMAL
  Filled 2023-10-03 (×2): qty 1

## 2023-10-03 MED ORDER — ACETAMINOPHEN 650 MG RE SUPP: 650.0000 mg | RECTAL | Status: DC | PRN

## 2023-10-03 MED ORDER — ASPIRIN 325 MG PO TABS
325.0000 mg | ORAL_TABLET | Freq: Once | ORAL | Status: AC
  Administered 2023-10-03: 325 mg via ORAL
  Filled 2023-10-03: qty 1

## 2023-10-03 NOTE — H&P (Signed)
 [facility] H&P   Patient Demographics:    Jeff Walker, is a 58 y.o. male  MRN: [id number]   DOB - Oct 10, 1965  Admit Date - 10/03/2023  Outpatient Primary MD for the patient is Dettinger, Lucio Sabin, MD  Referring MD/NP/PA: Dr Jeannie Milo  Patient coming from: home  Chief Complaint  Patient presents with   Cerebrovascular Accident      HPI:    Jeff Walker  is a 57 y.o. male, past medical history of hypertension, tobacco abuse, depression, anxiety, he presents to ED secondary to complaints of [place] 20-month ago, where he felt dizzy, lightheaded and actually passed out, he did not seek any medical attention then, patient still reports multiple neurological complaints over the last 2 months, he does report blurred vision in his right eye, some numbness in the left hand, arm as well, as well reports bilateral lower extremity numbness in his feet, has any facial droop, and recent event of loss of consciousness, he saw his PCP today which instructed him to come to ED for further evaluation - NAD MRI brain significant for notable acute infarcts in the left occipital area, and MRA head and neck significant for severe stenosis and left P2 occlusion, severe stenosis versus occlusion in the distal right V4 segment, ED discussion with neurology at [facility], he received loading dose of aspirin  and Plavix and admission has been requested to [facility].    Review of systems:   A full 10 point Review of Systems was done, except as stated above, all other Review of Systems were negative.   With Past History of the following :    Past Medical History:  Diagnosis Date   Depression    Environmental allergies    Hypertension    Shingles       Past Surgical History:  Procedure Laterality Date   ESOPHAGUS SURGERY     multiple as child, from his description sounds like esophageal atresia       Social History:     Social History   Tobacco Use   Smoking status: Every Day    Current packs/day: 1.00    Average packs/day: 1 pack/day for 35.0 years (35.0 ttl pk-yrs)    Types: Cigarettes   Smokeless tobacco: Never  Substance Use Topics   Alcohol use: Yes    Alcohol/week: 6.0 standard drinks of alcohol    Types: 6 Cans of beer per week    Comment: 1-2 beers per day  (makes his own beer)       Family History :     Family History  Problem Relation Age of Onset   Hypertension Mother    Hypertension Father    Cancer Father        skin   Thyroid disease Sister    Cancer Maternal Grandmother    Stroke Paternal Grandmother    Colon cancer Neg Hx  Colon polyps Neg Hx      Home Medications:   Prior to Admission medications   Medication Sig Start Date End Date Taking? Authorizing Provider  amLODipine  (NORVASC ) 10 MG tablet Take 1 tablet (10 mg total) by mouth daily. 08/06/23  Yes Martin, Mary-Margaret, FNP  lisinopril -hydrochlorothiazide  (ZESTORETIC ) 20-12.5 MG tablet Take 1 tablet by mouth daily. 08/06/23  Yes Martin, Mary-Margaret, FNP     Allergies:    No Known Allergies   Physical Exam:   Vitals  Blood pressure (!) 115/100, pulse (!) 106, temperature 97.7 F (36.5 C), resp. rate 18, height 6' (1.829 m), weight 84.4 kg, SpO2 96%.   1. General Alert male, laying in bed, no apparent distress  2. Normal affect and insight, Not Suicidal or Homicidal, Awake Alert, Oriented X 3.  3. No F.N deficits, ALL C.Nerves Intact, Strength 5/5 all 4 extremities, Sensation intact all 4 extremities, Plantars down going.  4. Ears and Eyes appear Normal, Conjunctivae clear, PERRLA. Moist Oral Mucosa.  5. Supple Neck, No JVD, No cervical lymphadenopathy appriciated, No Carotid Bruits.  6. Symmetrical Chest wall movement, Good air movement bilaterally, CTAB.  7. RRR, No Gallops, Rubs or Murmurs, No Parasternal Heave.  8. Positive Bowel Sounds, Abdomen Soft, No  tenderness, No organomegaly appriciated,No rebound -guarding or rigidity.  9.  No Cyanosis, Normal Skin Turgor, No Skin Rash or Bruise.  10. Good muscle tone,  joints appear normal , no effusions, Normal ROM.   Data Review:    CBC Recent Labs  Lab 10/03/23 1915  WBC 8.0  HGB 15.0  HCT 43.4  PLT 316  MCV 87.7  MCH 30.3  MCHC 34.6  RDW 13.3  LYMPHSABS 2.5  MONOABS 0.5  EOSABS 0.2  BASOSABS 0.1   ------------------------------------------------------------------------------------------------------------------  Chemistries  Recent Labs  Lab 10/03/23 1915  NA 133*  K 3.9  CL 98  CO2 24  GLUCOSE 96  BUN 17  CREATININE 1.59*  CALCIUM  9.1  AST 15  ALT 12  ALKPHOS 76  BILITOT 0.6   ------------------------------------------------------------------------------------------------------------------ estimated creatinine clearance is 55.6 mL/min (A) (by C-G formula based on SCr of 1.59 mg/dL (H)). ------------------------------------------------------------------------------------------------------------------ No results for input(s): "TSH", "T4TOTAL", "T3FREE", "THYROIDAB" in the last 72 hours.  Invalid input(s): "FREET3"  Coagulation profile Recent Labs  Lab 10/03/23 1915  INR 1.1   ------------------------------------------------------------------------------------------------------------------- No results for input(s): "DDIMER" in the last 72 hours. -------------------------------------------------------------------------------------------------------------------  Cardiac Enzymes No results for input(s): "CKMB", "TROPONINI", "MYOGLOBIN" in the last 168 hours.  Invalid input(s): "CK" ------------------------------------------------------------------------------------------------------------------ No results found for: "BNP"   ---------------------------------------------------------------------------------------------------------------  Urinalysis No  results found for: "COLORURINE", "APPEARANCEUR", "LABSPEC", "PHURINE", "GLUCOSEU", "HGBUR", "BILIRUBINUR", "KETONESUR", "PROTEINUR", "UROBILINOGEN", "NITRITE", "LEUKOCYTESUR"  ----------------------------------------------------------------------------------------------------------------   Imaging Results:    MR Angiogram Neck W or Wo Contrast Result Date: 10/03/2023 CLINICAL DATA:  Initial evaluation for acute stroke. EXAM: MRA NECK WITHOUT AND WITH CONTRAST MRA HEAD WITHOUT CONTRAST TECHNIQUE: Multiplanar and multiecho pulse sequences of the neck were obtained without and with intravenous contrast. Angiographic images of the neck were obtained using MRA technique without and with intravenous contrast; Angiographic images of the Circle of Willis were obtained using MRA technique without intravenous contrast. CONTRAST:  8.5mL GADAVIST GADOBUTROL 1 MMOL/ML IV SOLN COMPARISON:  Comparison made with brain MRI performed earlier the same day. FINDINGS: MRA NECK FINDINGS Aortic arch: Visualized aortic arch within normal limits for caliber with standard 3 vessel morphology. No stenosis or other abnormality about the origin the great vessels. Right carotid system: Right common and  internal carotid arteries are patent with antegrade flow. No evidence for dissection. No hemodynamically significant stenosis about the right carotid artery system. Left carotid system: Left common and internal carotid arteries are patent with antegrade flow. No evidence for dissection. No hemodynamically significant stenosis about the left carotid artery system. Vertebral arteries: Both vertebral arteries arise from subclavian arteries. No proximal subclavian artery stenosis. Left vertebral artery dominant and widely patent without stenosis or dissection. Focal severe stenosis or possibly short segment dissection involving the proximal right vertebral artery at its origin (series 13, image 56). Diminutive right vertebral arteries patent  distally without stenosis or dissection. Other: None. MRA HEAD FINDINGS Anterior circulation: Examination degraded by motion artifact. Atheromatous irregularity about the carotid siphons bilaterally without hemodynamically significant stenosis. Changes are slightly worse on the left. Right A1 dominant widely patent. Left A1 hypoplastic and/or absent, accounting for the diminutive left ICA is compared to the right. Normal anterior communicating artery complex. Both ACAs patent to their distal aspects without significant stenosis. No M1 stenosis or occlusion. Distal MCA branches perfused and fairly symmetric. Posterior circulation: Dominant left V4 segment patent without stenosis. Left PICA patent. Right vertebral artery patent as it courses into the cranial vault. Severe stenosis versus occlusion of the distal right V4 segment just prior to the vertebrobasilar junction (series 5, image 31). Right PICA not seen. Basilar patent without stenosis. Superior cerebral arteries patent bilaterally. Both PCAs primarily supplied via the basilar. Right PCA irregular with short-segment moderate right P1 stenosis (series 1021, image 9). There is acute occlusion of the left PCA at the proximal left P2 segment, in keeping with the left PCA territory infarct (series 1021, image 7). Anatomic variants: As above. Other: No intracranial aneurysm. IMPRESSION: MRA HEAD: 1. Acute proximal left P2 occlusion, in keeping with the left PCA territory infarct. 2. Severe stenosis versus occlusion of the distal right V4 segment just prior to the vertebrobasilar junction. 3. Short-segment moderate right P1 stenosis. 4. Otherwise wide patency of the major intracranial arterial circulation. No other hemodynamically significant or correctable stenosis. MRA NECK: 1. Focal severe stenosis versus short segment dissection involving the proximal right vertebral artery at its origin. Right vertebral artery diminutive but patent distally. Dominant left  vertebral artery widely patent within the neck. 2. Wide patency of both carotid artery systems within the neck. Electronically Signed   By: Virgia Griffins M.D.   On: 10/03/2023 20:08   MR ANGIO HEAD WO CONTRAST Result Date: 10/03/2023 CLINICAL DATA:  Initial evaluation for acute stroke. EXAM: MRA NECK WITHOUT AND WITH CONTRAST MRA HEAD WITHOUT CONTRAST TECHNIQUE: Multiplanar and multiecho pulse sequences of the neck were obtained without and with intravenous contrast. Angiographic images of the neck were obtained using MRA technique without and with intravenous contrast; Angiographic images of the Circle of Willis were obtained using MRA technique without intravenous contrast. CONTRAST:  8.5mL GADAVIST GADOBUTROL 1 MMOL/ML IV SOLN COMPARISON:  Comparison made with brain MRI performed earlier the same day. FINDINGS: MRA NECK FINDINGS Aortic arch: Visualized aortic arch within normal limits for caliber with standard 3 vessel morphology. No stenosis or other abnormality about the origin the great vessels. Right carotid system: Right common and internal carotid arteries are patent with antegrade flow. No evidence for dissection. No hemodynamically significant stenosis about the right carotid artery system. Left carotid system: Left common and internal carotid arteries are patent with antegrade flow. No evidence for dissection. No hemodynamically significant stenosis about the left carotid artery system. Vertebral arteries: Both vertebral arteries  arise from subclavian arteries. No proximal subclavian artery stenosis. Left vertebral artery dominant and widely patent without stenosis or dissection. Focal severe stenosis or possibly short segment dissection involving the proximal right vertebral artery at its origin (series 13, image 56). Diminutive right vertebral arteries patent distally without stenosis or dissection. Other: None. MRA HEAD FINDINGS Anterior circulation: Examination degraded by motion artifact.  Atheromatous irregularity about the carotid siphons bilaterally without hemodynamically significant stenosis. Changes are slightly worse on the left. Right A1 dominant widely patent. Left A1 hypoplastic and/or absent, accounting for the diminutive left ICA is compared to the right. Normal anterior communicating artery complex. Both ACAs patent to their distal aspects without significant stenosis. No M1 stenosis or occlusion. Distal MCA branches perfused and fairly symmetric. Posterior circulation: Dominant left V4 segment patent without stenosis. Left PICA patent. Right vertebral artery patent as it courses into the cranial vault. Severe stenosis versus occlusion of the distal right V4 segment just prior to the vertebrobasilar junction (series 5, image 31). Right PICA not seen. Basilar patent without stenosis. Superior cerebral arteries patent bilaterally. Both PCAs primarily supplied via the basilar. Right PCA irregular with short-segment moderate right P1 stenosis (series 1021, image 9). There is acute occlusion of the left PCA at the proximal left P2 segment, in keeping with the left PCA territory infarct (series 1021, image 7). Anatomic variants: As above. Other: No intracranial aneurysm. IMPRESSION: MRA HEAD: 1. Acute proximal left P2 occlusion, in keeping with the left PCA territory infarct. 2. Severe stenosis versus occlusion of the distal right V4 segment just prior to the vertebrobasilar junction. 3. Short-segment moderate right P1 stenosis. 4. Otherwise wide patency of the major intracranial arterial circulation. No other hemodynamically significant or correctable stenosis. MRA NECK: 1. Focal severe stenosis versus short segment dissection involving the proximal right vertebral artery at its origin. Right vertebral artery diminutive but patent distally. Dominant left vertebral artery widely patent within the neck. 2. Wide patency of both carotid artery systems within the neck. Electronically Signed   By:  Virgia Griffins M.D.   On: 10/03/2023 20:08   MR Brain Wo Contrast Addendum Date: 10/03/2023 ADDENDUM REPORT: 10/03/2023 17:21 ADDENDUM: Impression #1 called by telephone at the time of interpretation on 10/03/2023 at 5:19 pm to provider Jolyne Needs , who verbally acknowledged these results. Electronically Signed   By: Bascom Lily D.O.   On: 10/03/2023 17:21   Result Date: 10/03/2023 CLINICAL DATA:  Provided history: Neuro deficit, acute, stroke suspected. EXAM: MRI HEAD WITHOUT CONTRAST TECHNIQUE: Multiplanar, multiecho pulse sequences of the brain and surrounding structures were obtained without intravenous contrast. COMPARISON:  Head CT 01/29/2011. FINDINGS: Brain: Mild generalized cerebral atrophy. Multiple acute cortical infarcts within the left occipital lobe measuring up to 2.7 cm (PCA vascular territory). Multiple small acute cortical infarcts within the left occipital lobe (PCA vascular territory). Chronic lacunar infarct within the right lentiform nucleus (series 10, image 16). Multifocal T2 FLAIR hyperintense signal abnormality within the cerebral white matter, nonspecific but compatible with moderate chronic small vessel ischemic disease. Findings are greater than expected for age. No evidence of an intracranial mass. No chronic intracranial blood products. No extra-axial fluid collection. No midline shift. Vascular: Maintained flow voids within the proximal large arterial vessels. Skull and upper cervical spine: No focal worrisome marrow lesion. Incompletely assessed cervical spondylosis. Sinuses/Orbits: No mass or acute finding within the imaged orbits. No significant paranasal sinus disease. Other: Trace fluid within right mastoid air cells. Attempts are being made to reach the  ordering provider at this time. IMPRESSION: 1. Multiple acute cortical infarcts within the left occipital lobe measuring up to 2.7 cm (PCA vascular territory). 2. Chronic lacunar infarct within the right  lentiform nucleus 3. Background chronic small vessel ischemic changes within the cerebral white matter, moderate in severity and greater than expected for age. 4. Mild generalized cerebral atrophy. 5. Trace right mastoid effusion. Electronically Signed: By: Bascom Lily D.O. On: 10/03/2023 17:14    EKG:  Vent. rate 80 BPM PR interval 145 ms QRS duration 92 ms QT/QTcB 361/417 ms P-R-T axes 66 -68 39 Sinus rhythm Probable left atrial enlargement Consider right ventricular hypertrophy Inferior infarct, old Consider anterior infarct  Assessment & Plan:    Principal Problem:   Acute CVA (cerebrovascular accident) (HCC) Active Problems:   Hypertension   Depression, recurrent (HCC)   Tobacco use   Acute CVA - Patient presents with intermittent complain of vision problem, tingling numbness left upper extremity, and bilateral lower extremity over the last 2 months. - MRI brain significant for Multiple acute cortical infarcts within the left occipital lobe measuring up to 2.7 cm (PCA vascular territory).Chronic lacunar infarct within the right lentiform nucleus. - MRA head with acute proximal left P2 occlusion, and severe stenosis versus occlusion of the distal right V4 segment, - A neck focal severe stenosis versus short segment dissection involving the proximal right vertebral artery at its origin, right vertebral artery diminutive but patent distally. - Neurology input appreciated, he received full dose aspirin  and Plavix while in ED, will await further recommendations 1 seen by neurology at [facility] - Monitor on telemetry - Will check 2D echo - Check lipid panel -Will check A1c -Will consult PT, OT, SLP -Low for permissive hypertension  Hypertension -Hold home medication and allow for permissive hypertension  Tobacco abuse -he was counseled, will start on nicotine patch   DVT Prophylaxis Heparin   AM Labs Ordered, also please review Full Orders  Family Communication:  Admission, patients condition and plan of care including tests being ordered have been discussed with the patient  who indicate understanding and agree with the plan and Code Status.  Code Status full code  Likely DC to home  Consults called: Neurology  Admission status: Inpatient  Time spent in minutes : 70 minutes   Seena Dadds M.D on 10/03/2023 at 8:56 PM   [facility] - Office  [phone]

## 2023-10-03 NOTE — ED Triage Notes (Signed)
 Pt arrived via wheel chair from outpatient MRI who report Pts last known well was 2 months ago. Pt reported to his PCP Dr Glady Laming, he has been having left side numbness/weakness and visual changes for 2 months. Dr Glady Laming ordered out-patient MRI, and MRI Tech reports he has had a stroke and was advised to have the Pt evaluated in the ER. EP Notified and aware.

## 2023-10-03 NOTE — Progress Notes (Signed)
 BP 116/72   Ht 6' (1.829 m)   Wt 186 lb (84.4 kg)   SpO2 96%   BMI 25.23 kg/m    Subjective:   Patient ID: Jeff Walker, male    DOB: November 07, 1965, 58 y.o.   MRN: [id number]  HPI: Jeff Walker is a 58 y.o. male presenting on 10/03/2023 for Medical Management of Chronic Issues and Hypertension   HPI Hypertension Patient is currently on amlodipine  and lisinopril -hydrochlorothiazide , and their blood pressure today is 116/72.  Patient says he had this episode about 2 months ago where he felt really dizzy and lightheaded and he actually passed out and was at the grocery store.  He was not seen by anybody immediately but then came in about a month ago and was seen for blood pressure.  His blood pressure at that time was 199/109 and he was changed from hydrochlorothiazide  and amlodipine  to lisinopril  hydrochlorothiazide  and amlodipine .  Since that time he has continued to have some blurred vision in his right eye and some numbness in his left hand and arm and numbness in both feet.  He says the numbness and pain in both feet may have been something he is dealt with with his back issue but the numbness in his left hand and the blurred vision in his right eye has been new.  He has not seen anybody for those.  Relevant past medical, surgical, family and social history reviewed and updated as indicated. Interim medical history since our last visit reviewed. Allergies and medications reviewed and updated.  Review of Systems  Constitutional:  Negative for chills and fever.  Eyes:  Negative for visual disturbance.  Respiratory:  Negative for shortness of breath and wheezing.   Cardiovascular:  Negative for chest pain and leg swelling.  Musculoskeletal:  Negative for back pain and gait problem.  Skin:  Negative for rash.  Neurological:  Negative for dizziness and light-headedness.  All other systems reviewed and are negative.   Per HPI unless specifically indicated above   Allergies as of  10/03/2023   No Known Allergies      Medication List        Accurate as of Oct 03, 2023  2:53 PM. If you have any questions, ask your nurse or doctor.          STOP taking these medications    albuterol  108 (90 Base) MCG/ACT inhaler Commonly known as: VENTOLIN  HFA Stopped by: Lucio Sabin Kimberlee Shoun   hydrochlorothiazide  25 MG tablet Commonly known as: HYDRODIURIL  Stopped by: Lucio Sabin Zebulan Hinshaw   predniSONE  20 MG tablet Commonly known as: DELTASONE  Stopped by: Lucio Sabin Eydie Wormley       TAKE these medications    amLODipine  10 MG tablet Commonly known as: NORVASC  Take 1 tablet (10 mg total) by mouth daily.   lisinopril -hydrochlorothiazide  20-12.5 MG tablet Commonly known as: Zestoretic  Take 1 tablet by mouth daily.         Objective:   BP 116/72   Ht 6' (1.829 m)   Wt 186 lb (84.4 kg)   SpO2 96%   BMI 25.23 kg/m   Wt Readings from Last 3 Encounters:  10/03/23 186 lb (84.4 kg)  08/06/23 190 lb (86.2 kg)  04/25/22 180 lb (81.6 kg)    Physical Exam Vitals and nursing note reviewed.  Constitutional:      General: He is not in acute distress.    Appearance: He is well-developed. He is not diaphoretic.  Eyes:  General: No scleral icterus.    Conjunctiva/sclera: Conjunctivae normal.  Neck:     Thyroid: No thyromegaly.  Cardiovascular:     Rate and Rhythm: Normal rate and regular rhythm.     Heart sounds: Normal heart sounds. No murmur heard. Pulmonary:     Effort: Pulmonary effort is normal. No respiratory distress.     Breath sounds: Normal breath sounds. No wheezing.  Musculoskeletal:        General: Normal range of motion.     Cervical back: Neck supple.  Lymphadenopathy:     Cervical: No cervical adenopathy.  Skin:    General: Skin is warm and dry.     Findings: No rash.  Neurological:     Mental Status: He is alert and oriented to person, place, and time.     Coordination: Coordination normal.  Psychiatric:        Behavior: Behavior  normal.       Assessment & Plan:   Problem List Items Addressed This Visit       Cardiovascular and Mediastinum   Hypertension - Primary   Other Visit Diagnoses       Visual changes       Relevant Orders   MR Brain Wo Contrast     Left sided numbness       Relevant Orders   MR Brain Wo Contrast     Concerned that possibly with his right-sided visual deficit and left-sided numbness still been present for about the past 2 months that he may have had a small stroke, we are going to do an MRI today.  Blood pressure does look better today.  Follow up plan: Return in about 6 months (around 04/04/2024), or if symptoms worsen or fail to improve, for Hypertension recheck.  Counseling provided for all of the vaccine components Orders Placed This Encounter  Procedures   MR Brain Wo Contrast    Jolyne Needs, MD Vickie Grana [facility] 10/03/2023, 2:53 PM

## 2023-10-03 NOTE — Telephone Encounter (Signed)
 Critical Results from Radiology.  Radiologist calling, requesting to speak with Dr. Steen Eden directly about critical MRI results.  Dr. Steen Eden contacted appropriately and was merged in the call.   Dr. Steen Eden received critical results appropriately for the patient.   No additional questions/concerns noted during the time of the call.

## 2023-10-03 NOTE — ED Provider Notes (Signed)
 [facility] EMERGENCY DEPARTMENT AT [facility] Provider Note  CSN: [id number] Arrival date & time: 10/03/23 1729  Chief Complaint(s) Cerebrovascular Accident  HPI Jeff Walker is a 58 y.o. male with PMH HTN who presents to Emergency Department for evaluation of an abnormal MRI, visual changes, arm and hand numbness.  Patient states that he has had a constellation of symptoms over the last 2 months and given his visual changes and hand numbness, his PCP sent him to [facility] for a stat MRI.  This MRI did show evidence of acute stroke and he was thus officially sent to the emergency department for further evaluation.  Here in the emergency department, he is completely asymptomatic and is denying current visual changes, chest pain, shortness of breath abdominal pain, nausea, vomiting, numbness, tingling, weakness or other neurologic or systemic complaints.   Past Medical History Past Medical History:  Diagnosis Date   Depression    Environmental allergies    Hypertension    Shingles    Patient Active Problem List   Diagnosis Date Noted   Anxiety 09/10/2017   Tobacco use 09/10/2017   Dysphagia 08/27/2017   Depression, recurrent (HCC) 08/21/2017   Hypertension 09/20/2016   Home Medication(s) Prior to Admission medications   Medication Sig Start Date End Date Taking? Authorizing Provider  amLODipine  (NORVASC ) 10 MG tablet Take 1 tablet (10 mg total) by mouth daily. 08/06/23   Delfina Feller, FNP  lisinopril -hydrochlorothiazide  (ZESTORETIC ) 20-12.5 MG tablet Take 1 tablet by mouth daily. 08/06/23   Delfina Feller, FNP                                                                                                                                    Past Surgical History Past Surgical History:  Procedure Laterality Date   ESOPHAGUS SURGERY     multiple as child, from his description sounds like esophageal atresia   Family History Family History  Problem  Relation Age of Onset   Hypertension Mother    Hypertension Father    Cancer Father        skin   Thyroid disease Sister    Cancer Maternal Grandmother    Stroke Paternal Grandmother    Colon cancer Neg Hx    Colon polyps Neg Hx     Social History Social History   Tobacco Use   Smoking status: Every Day    Current packs/day: 1.00    Average packs/day: 1 pack/day for 35.0 years (35.0 ttl pk-yrs)    Types: Cigarettes   Smokeless tobacco: Never  Vaping Use   Vaping status: Former  Substance Use Topics   Alcohol use: Yes    Alcohol/week: 6.0 standard drinks of alcohol    Types: 6 Cans of beer per week    Comment: 1-2 beers per day  (makes his own beer)    Drug use: No   Allergies Patient has no known allergies.  Review of Systems Review of Systems  Eyes:  Positive for visual disturbance.  Neurological:  Positive for numbness.    Physical Exam Vital Signs  I have reviewed the triage vital signs BP (!) 115/100   Pulse (!) 106   Temp 97.7 F (36.5 C)   Resp 18   Ht 6' (1.829 m)   Wt 84.4 kg   SpO2 96%   BMI 25.23 kg/m   Physical Exam Constitutional:      General: He is not in acute distress.    Appearance: Normal appearance.  HENT:     Head: Normocephalic and atraumatic.     Nose: No congestion or rhinorrhea.  Eyes:     General:        Right eye: No discharge.        Left eye: No discharge.     Extraocular Movements: Extraocular movements intact.     Pupils: Pupils are equal, round, and reactive to light.  Cardiovascular:     Rate and Rhythm: Normal rate and regular rhythm.     Heart sounds: No murmur heard. Pulmonary:     Effort: No respiratory distress.     Breath sounds: No wheezing or rales.  Abdominal:     General: There is no distension.     Tenderness: There is no abdominal tenderness.  Musculoskeletal:        General: Normal range of motion.     Cervical back: Normal range of motion.  Skin:    General: Skin is warm and dry.   Neurological:     General: No focal deficit present.     Mental Status: He is alert.     Cranial Nerves: No cranial nerve deficit.     Sensory: No sensory deficit.     Motor: No weakness.     ED Results and Treatments Labs (all labs ordered are listed, but only abnormal results are displayed) Labs Reviewed  COMPREHENSIVE METABOLIC PANEL WITH GFR - Abnormal; Notable for the following components:      Result Value   Sodium 133 (*)    Creatinine, Ser 1.59 (*)    GFR, Estimated 50 (*)    All other components within normal limits  CBC WITH DIFFERENTIAL/PLATELET  PROTIME-INR                                                                                                                          Radiology MR Angiogram Neck W or Wo Contrast Result Date: 10/03/2023 CLINICAL DATA:  Initial evaluation for acute stroke. EXAM: MRA NECK WITHOUT AND WITH CONTRAST MRA HEAD WITHOUT CONTRAST TECHNIQUE: Multiplanar and multiecho pulse sequences of the neck were obtained without and with intravenous contrast. Angiographic images of the neck were obtained using MRA technique without and with intravenous contrast; Angiographic images of the Circle of Willis were obtained using MRA technique without intravenous contrast. CONTRAST:  8.5mL GADAVIST GADOBUTROL 1 MMOL/ML IV SOLN COMPARISON:  Comparison made with brain MRI performed  earlier the same day. FINDINGS: MRA NECK FINDINGS Aortic arch: Visualized aortic arch within normal limits for caliber with standard 3 vessel morphology. No stenosis or other abnormality about the origin the great vessels. Right carotid system: Right common and internal carotid arteries are patent with antegrade flow. No evidence for dissection. No hemodynamically significant stenosis about the right carotid artery system. Left carotid system: Left common and internal carotid arteries are patent with antegrade flow. No evidence for dissection. No hemodynamically significant stenosis about  the left carotid artery system. Vertebral arteries: Both vertebral arteries arise from subclavian arteries. No proximal subclavian artery stenosis. Left vertebral artery dominant and widely patent without stenosis or dissection. Focal severe stenosis or possibly short segment dissection involving the proximal right vertebral artery at its origin (series 13, image 56). Diminutive right vertebral arteries patent distally without stenosis or dissection. Other: None. MRA HEAD FINDINGS Anterior circulation: Examination degraded by motion artifact. Atheromatous irregularity about the carotid siphons bilaterally without hemodynamically significant stenosis. Changes are slightly worse on the left. Right A1 dominant widely patent. Left A1 hypoplastic and/or absent, accounting for the diminutive left ICA is compared to the right. Normal anterior communicating artery complex. Both ACAs patent to their distal aspects without significant stenosis. No M1 stenosis or occlusion. Distal MCA branches perfused and fairly symmetric. Posterior circulation: Dominant left V4 segment patent without stenosis. Left PICA patent. Right vertebral artery patent as it courses into the cranial vault. Severe stenosis versus occlusion of the distal right V4 segment just prior to the vertebrobasilar junction (series 5, image 31). Right PICA not seen. Basilar patent without stenosis. Superior cerebral arteries patent bilaterally. Both PCAs primarily supplied via the basilar. Right PCA irregular with short-segment moderate right P1 stenosis (series 1021, image 9). There is acute occlusion of the left PCA at the proximal left P2 segment, in keeping with the left PCA territory infarct (series 1021, image 7). Anatomic variants: As above. Other: No intracranial aneurysm. IMPRESSION: MRA HEAD: 1. Acute proximal left P2 occlusion, in keeping with the left PCA territory infarct. 2. Severe stenosis versus occlusion of the distal right V4 segment just prior to  the vertebrobasilar junction. 3. Short-segment moderate right P1 stenosis. 4. Otherwise wide patency of the major intracranial arterial circulation. No other hemodynamically significant or correctable stenosis. MRA NECK: 1. Focal severe stenosis versus short segment dissection involving the proximal right vertebral artery at its origin. Right vertebral artery diminutive but patent distally. Dominant left vertebral artery widely patent within the neck. 2. Wide patency of both carotid artery systems within the neck. Electronically Signed   By: Virgia Griffins M.D.   On: 10/03/2023 20:08   MR ANGIO HEAD WO CONTRAST Result Date: 10/03/2023 CLINICAL DATA:  Initial evaluation for acute stroke. EXAM: MRA NECK WITHOUT AND WITH CONTRAST MRA HEAD WITHOUT CONTRAST TECHNIQUE: Multiplanar and multiecho pulse sequences of the neck were obtained without and with intravenous contrast. Angiographic images of the neck were obtained using MRA technique without and with intravenous contrast; Angiographic images of the Circle of Willis were obtained using MRA technique without intravenous contrast. CONTRAST:  8.5mL GADAVIST GADOBUTROL 1 MMOL/ML IV SOLN COMPARISON:  Comparison made with brain MRI performed earlier the same day. FINDINGS: MRA NECK FINDINGS Aortic arch: Visualized aortic arch within normal limits for caliber with standard 3 vessel morphology. No stenosis or other abnormality about the origin the great vessels. Right carotid system: Right common and internal carotid arteries are patent with antegrade flow. No evidence for dissection. No hemodynamically significant  stenosis about the right carotid artery system. Left carotid system: Left common and internal carotid arteries are patent with antegrade flow. No evidence for dissection. No hemodynamically significant stenosis about the left carotid artery system. Vertebral arteries: Both vertebral arteries arise from subclavian arteries. No proximal subclavian artery  stenosis. Left vertebral artery dominant and widely patent without stenosis or dissection. Focal severe stenosis or possibly short segment dissection involving the proximal right vertebral artery at its origin (series 13, image 56). Diminutive right vertebral arteries patent distally without stenosis or dissection. Other: None. MRA HEAD FINDINGS Anterior circulation: Examination degraded by motion artifact. Atheromatous irregularity about the carotid siphons bilaterally without hemodynamically significant stenosis. Changes are slightly worse on the left. Right A1 dominant widely patent. Left A1 hypoplastic and/or absent, accounting for the diminutive left ICA is compared to the right. Normal anterior communicating artery complex. Both ACAs patent to their distal aspects without significant stenosis. No M1 stenosis or occlusion. Distal MCA branches perfused and fairly symmetric. Posterior circulation: Dominant left V4 segment patent without stenosis. Left PICA patent. Right vertebral artery patent as it courses into the cranial vault. Severe stenosis versus occlusion of the distal right V4 segment just prior to the vertebrobasilar junction (series 5, image 31). Right PICA not seen. Basilar patent without stenosis. Superior cerebral arteries patent bilaterally. Both PCAs primarily supplied via the basilar. Right PCA irregular with short-segment moderate right P1 stenosis (series 1021, image 9). There is acute occlusion of the left PCA at the proximal left P2 segment, in keeping with the left PCA territory infarct (series 1021, image 7). Anatomic variants: As above. Other: No intracranial aneurysm. IMPRESSION: MRA HEAD: 1. Acute proximal left P2 occlusion, in keeping with the left PCA territory infarct. 2. Severe stenosis versus occlusion of the distal right V4 segment just prior to the vertebrobasilar junction. 3. Short-segment moderate right P1 stenosis. 4. Otherwise wide patency of the major intracranial arterial  circulation. No other hemodynamically significant or correctable stenosis. MRA NECK: 1. Focal severe stenosis versus short segment dissection involving the proximal right vertebral artery at its origin. Right vertebral artery diminutive but patent distally. Dominant left vertebral artery widely patent within the neck. 2. Wide patency of both carotid artery systems within the neck. Electronically Signed   By: Virgia Griffins M.D.   On: 10/03/2023 20:08   MR Brain Wo Contrast Addendum Date: 10/03/2023 ADDENDUM REPORT: 10/03/2023 17:21 ADDENDUM: Impression #1 called by telephone at the time of interpretation on 10/03/2023 at 5:19 pm to provider Jolyne Needs , who verbally acknowledged these results. Electronically Signed   By: Bascom Lily D.O.   On: 10/03/2023 17:21   Result Date: 10/03/2023 CLINICAL DATA:  Provided history: Neuro deficit, acute, stroke suspected. EXAM: MRI HEAD WITHOUT CONTRAST TECHNIQUE: Multiplanar, multiecho pulse sequences of the brain and surrounding structures were obtained without intravenous contrast. COMPARISON:  Head CT 01/29/2011. FINDINGS: Brain: Mild generalized cerebral atrophy. Multiple acute cortical infarcts within the left occipital lobe measuring up to 2.7 cm (PCA vascular territory). Multiple small acute cortical infarcts within the left occipital lobe (PCA vascular territory). Chronic lacunar infarct within the right lentiform nucleus (series 10, image 16). Multifocal T2 FLAIR hyperintense signal abnormality within the cerebral white matter, nonspecific but compatible with moderate chronic small vessel ischemic disease. Findings are greater than expected for age. No evidence of an intracranial mass. No chronic intracranial blood products. No extra-axial fluid collection. No midline shift. Vascular: Maintained flow voids within the proximal large arterial vessels. Skull and upper cervical  spine: No focal worrisome marrow lesion. Incompletely assessed cervical  spondylosis. Sinuses/Orbits: No mass or acute finding within the imaged orbits. No significant paranasal sinus disease. Other: Trace fluid within right mastoid air cells. Attempts are being made to reach the ordering provider at this time. IMPRESSION: 1. Multiple acute cortical infarcts within the left occipital lobe measuring up to 2.7 cm (PCA vascular territory). 2. Chronic lacunar infarct within the right lentiform nucleus 3. Background chronic small vessel ischemic changes within the cerebral white matter, moderate in severity and greater than expected for age. 4. Mild generalized cerebral atrophy. 5. Trace right mastoid effusion. Electronically Signed: By: Bascom Lily D.O. On: 10/03/2023 17:14    Pertinent labs & imaging results that were available during my care of the patient were reviewed by me and considered in my medical decision making (see MDM for details).  Medications Ordered in ED Medications  gadobutrol (GADAVIST) 1 MMOL/ML injection 8.5 mL (8.5 mLs Intravenous Contrast Given 10/03/23 1936)  aspirin  tablet 325 mg (325 mg Oral Given 10/03/23 2039)  clopidogrel (PLAVIX) tablet 300 mg (300 mg Oral Given 10/03/23 2039)                                                                                                                                     Procedures .Critical Care  Performed by: Karlyn Overman, MD Authorized by: Karlyn Overman, MD   Critical care provider statement:    Critical care time (minutes):  30   Critical care was necessary to treat or prevent imminent or life-threatening deterioration of the following conditions:  CNS failure or compromise   Critical care was time spent personally by me on the following activities:  Development of treatment plan with patient or surrogate, discussions with consultants, evaluation of patient's response to treatment, examination of patient, ordering and review of laboratory studies, ordering and review of radiographic studies, ordering  and performing treatments and interventions, pulse oximetry, re-evaluation of patient's condition and review of old charts   (including critical care time)  Medical Decision Making / ED Course   This patient presents to the ED for concern of abnormal MRI, visual changes, numbness, this involves an extensive number of treatment options, and is a complaint that carries with it a high risk of complications and morbidity.  The differential diagnosis includes CVA, hemorrhagic stroke, mass, Todd's paralysis, seizure, electrolyte abnormality, encephalopathy, complicated migraine  MDM: Patient seen emerged part for evaluation of an abnormal MRI with numbness.  Physical exam is largely unremarkable in the ER today with no focal motor or sensory deficits.  No cranial nerve deficits.  Initial noncontrasted MRI showing multiple acute cortical infarct in the left occipital lobe concerning for a PCA stroke.  MR angiography shows an acute proximal left P2 occlusion, severe stenosis/occlusion of the distal right V4 segment, focal severe stenosis versus short segment dissection in the proximal right vertebral artery.  I spoke with the  neurologist on-call Dr. Murvin Arthurs who is recommending hospital admission and transfer to [facility].  Patient is not a TNK candidate as he is outside window.  Neurology recommending aspirin  Plavix load and patient admitted.   Additional history obtained:  -External records from outside source obtained and reviewed including: Chart review including previous notes, labs, imaging, consultation notes   Lab Tests: -I ordered, reviewed, and interpreted labs.   The pertinent results include:   Labs Reviewed  COMPREHENSIVE METABOLIC PANEL WITH GFR - Abnormal; Notable for the following components:      Result Value   Sodium 133 (*)    Creatinine, Ser 1.59 (*)    GFR, Estimated 50 (*)    All other components within normal limits  CBC WITH DIFFERENTIAL/PLATELET  PROTIME-INR       EKG   EKG Interpretation Date/Time:  Thursday Oct 03 2023 21:30:16 EDT Ventricular Rate:  91 PR Interval:  143 QRS Duration:  85 QT Interval:  358 QTC Calculation: 441 R Axis:   -74  Text Interpretation: Sinus rhythm Inferior infarct, old Confirmed by Ezri Fanguy ([id number]) on 10/04/2023 9:50:25 AM         Imaging Studies ordered: I ordered imaging studies including MRA brain, MRA neck I independently visualized and interpreted imaging. I agree with the radiologist interpretation   Medicines ordered and prescription drug management: Meds ordered this encounter  Medications   gadobutrol (GADAVIST) 1 MMOL/ML injection 8.5 mL   aspirin  tablet 325 mg   clopidogrel (PLAVIX) tablet 300 mg    -I have reviewed the patients home medicines and have made adjustments as needed  Critical interventions Consideration of TNK, neurology consultation, emergent MRI  Consultations Obtained: I requested consultation with the neurologist on-call,  and discussed lab and imaging findings as well as pertinent plan - they recommend: Aspirin , Plavix, [place] admission   Cardiac Monitoring: The patient was maintained on a cardiac monitor.  I personally viewed and interpreted the cardiac monitored which showed an underlying rhythm of: NSR  Social Determinants of Health:  Factors impacting patients care include: none   Reevaluation: After the interventions noted above, I reevaluated the patient and found that they have :stayed the same  Co morbidities that complicate the patient evaluation  Past Medical History:  Diagnosis Date   Depression    Environmental allergies    Hypertension    Shingles       Dispostion: I considered admission for this patient, but require admission for new stroke     Final Clinical Impression(s) / ED Diagnoses Final diagnoses:  Cerebrovascular accident (CVA), unspecified mechanism (HCC)     @PCDICTATION @    Karlyn Overman, MD 10/04/23  [phone]

## 2023-10-04 ENCOUNTER — Ambulatory Visit: Payer: Self-pay | Admitting: Family Medicine

## 2023-10-04 ENCOUNTER — Inpatient Hospital Stay (HOSPITAL_BASED_OUTPATIENT_CLINIC_OR_DEPARTMENT_OTHER)

## 2023-10-04 ENCOUNTER — Other Ambulatory Visit (HOSPITAL_COMMUNITY): Payer: Self-pay | Admitting: *Deleted

## 2023-10-04 DIAGNOSIS — I639 Cerebral infarction, unspecified: Secondary | ICD-10-CM | POA: Diagnosis not present

## 2023-10-04 DIAGNOSIS — I779 Disorder of arteries and arterioles, unspecified: Secondary | ICD-10-CM

## 2023-10-04 DIAGNOSIS — F17210 Nicotine dependence, cigarettes, uncomplicated: Secondary | ICD-10-CM

## 2023-10-04 DIAGNOSIS — I63432 Cerebral infarction due to embolism of left posterior cerebral artery: Secondary | ICD-10-CM | POA: Diagnosis not present

## 2023-10-04 DIAGNOSIS — R29701 NIHSS score 1: Secondary | ICD-10-CM | POA: Diagnosis not present

## 2023-10-04 DIAGNOSIS — I6389 Other cerebral infarction: Secondary | ICD-10-CM

## 2023-10-04 LAB — ECHOCARDIOGRAM COMPLETE
AR max vel: 2.34 cm2
AV Area VTI: 2.37 cm2
AV Area mean vel: 2.25 cm2
AV Mean grad: 5 mmHg
AV Peak grad: 7.7 mmHg
Ao pk vel: 1.39 m/s
Area-P 1/2: 1.94 cm2
Height: 72 in
S' Lateral: 2.9 cm
Weight: 2976 [oz_av]

## 2023-10-04 LAB — CBC
HCT: 41.1 % (ref 39.0–52.0)
Hemoglobin: 13.9 g/dL (ref 13.0–17.0)
MCH: 29.7 pg (ref 26.0–34.0)
MCHC: 33.8 g/dL (ref 30.0–36.0)
MCV: 87.8 fL (ref 80.0–100.0)
Platelets: 281 10*3/uL (ref 150–400)
RBC: 4.68 MIL/uL (ref 4.22–5.81)
RDW: 13.2 % (ref 11.5–15.5)
WBC: 7.3 10*3/uL (ref 4.0–10.5)
nRBC: 0 % (ref 0.0–0.2)

## 2023-10-04 LAB — HEMOGLOBIN A1C
Hgb A1c MFr Bld: 5.6 % (ref 4.8–5.6)
Mean Plasma Glucose: 114.02 mg/dL

## 2023-10-04 LAB — LIPID PANEL
Cholesterol: 208 mg/dL — ABNORMAL HIGH (ref 0–200)
HDL: 30 mg/dL — ABNORMAL LOW (ref >40)
LDL Cholesterol: 142 mg/dL — ABNORMAL HIGH (ref 0–99)
Total CHOL/HDL Ratio: 6.9 ratio
Triglycerides: 179 mg/dL — ABNORMAL HIGH (ref <150)
VLDL: 36 mg/dL (ref 0–40)

## 2023-10-04 LAB — HIV ANTIBODY (ROUTINE TESTING W REFLEX): HIV Screen 4th Generation wRfx: NONREACTIVE

## 2023-10-04 MED ORDER — ROSUVASTATIN CALCIUM 20 MG PO TABS
20.0000 mg | ORAL_TABLET | Freq: Every day | ORAL | Status: DC
  Administered 2023-10-04: 20 mg via ORAL
  Filled 2023-10-04: qty 1

## 2023-10-04 MED ORDER — PERFLUTREN LIPID MICROSPHERE
1.0000 mL | INTRAVENOUS | Status: AC | PRN
  Administered 2023-10-04: 3 mL via INTRAVENOUS

## 2023-10-04 MED ORDER — ROSUVASTATIN CALCIUM 20 MG PO TABS
40.0000 mg | ORAL_TABLET | Freq: Every day | ORAL | Status: DC
  Administered 2023-10-05: 40 mg via ORAL
  Filled 2023-10-04: qty 2

## 2023-10-04 NOTE — Progress Notes (Signed)
 SLP Cancellation Note  Patient Details Name: Jeff Walker MRN: [id number] DOB: 10/06/1965   Cancelled treatment:       Reason Eval/Treat Not Completed: Patient at procedure or test/unavailable   Alston Jerry 10/04/2023, 3:57 PM

## 2023-10-04 NOTE — Consult Note (Signed)
 I connected with  Jeff Walker on 10/04/23 by a video enabled telemedicine application and verified that I am speaking with the correct person using two identifiers.   I discussed the limitations of evaluation and management by telemedicine. The patient expressed understanding and agreed to proceed.  Location of patient: [facility] Location of physician: [facility]   Neurology Consultation Reason for Consult: stroke Referring Physician: Dr Karlyn Overman  CC: right eye vision disturbance  History is obtained from: patient, chart review  HPI: Jeff Walker is a 58 y.o. male with PMH of HTN, nicotine use who presented with intermittent loss of vision in right eye. States he fell about 3 months ago and since then had been noticing BL feet numbness as well as intermittent vision loss in right eye. He talked to his PCP who recommended MRI brain. MR brain showed acute stroke so he was sent to [facility].  LKN 3 months ago  Event happened at home No tpa and thrombectomy as outside window mRS 0  ROS: All other systems reviewed and negative except as noted in the HPI.   Past Medical History:  Diagnosis Date   Depression    Environmental allergies    Hypertension    Shingles     Family History  Problem Relation Age of Onset   Hypertension Mother    Hypertension Father    Cancer Father        skin   Thyroid disease Sister    Cancer Maternal Grandmother    Stroke Paternal Grandmother    Colon cancer Neg Hx    Colon polyps Neg Hx     Social History:  reports that he has been smoking cigarettes. He has a 35 pack-year smoking history. He has never used smokeless tobacco. He reports current alcohol use of about 6.0 standard drinks of alcohol per week. He reports that he does not use drugs.    Exam: Current vital signs: BP 118/76 (BP Location: Right Arm)   Pulse 82   Temp 98.1 F (36.7 C) (Oral)   Resp 19   Ht 6' (1.829 m)   Wt 84.4 kg   SpO2 98%   BMI 25.23 kg/m  Vital  signs in last 24 hours: Temp:  [97.7 F (36.5 C)-98.1 F (36.7 C)] 98.1 F (36.7 C) (05/23 1515) Pulse Rate:  [75-110] 82 (05/23 1515) Resp:  [15-22] 19 (05/23 1515) BP: (101-133)/(67-100) 118/76 (05/23 1515) SpO2:  [95 %-99 %] 98 % (05/23 1515) Weight:  [84.4 kg] 84.4 kg (05/22 1836)   Physical Exam  Constitutional: Appears well-developed and well-nourished.  Psych: Affect appropriate to situation Neuro: AOX3, CN grossly intact ( hemianopia exam limited via tele), antigravity strength in all extremities, decreased sensation to touch in BL feet and left hand, FTN intact   NIHSS 1  INPUTS: 1A: Level of consciousness --> 0 = Alert; keenly responsive 1B: Ask month and age --> 0 = Both questions right 1C: 'Blink eyes' & 'squeeze hands' --> 0 = Performs both tasks 2: Horizontal extraocular movements --> 0 = Normal 3: Visual fields --> 0 = No visual loss 4: Facial palsy --> 0 = Normal symmetry 5A: Left arm motor drift --> 0 = No drift for 10 seconds 5B: Right arm motor drift --> 0 = No drift for 10 seconds 6A: Left leg motor drift --> 0 = No drift for 5 seconds 6B: Right leg motor drift --> 0 = No drift for 5 seconds 7: Limb Ataxia -->  0 = No ataxia 8: Sensation --> 1 = Mild-moderate loss: can sense being touched  9: Language/aphasia --> 0 = Normal; no aphasia 10: Dysarthria --> 0 = Normal 11: Extinction/inattention --> 0 = No abnormality     I have reviewed labs in epic and the results pertinent to this consultation are: CBC:  Recent Labs  Lab 10/03/23 1915 10/04/23 0519  WBC 8.0 7.3  NEUTROABS 4.7  --   HGB 15.0 13.9  HCT 43.4 41.1  MCV 87.7 87.8  PLT 316 281    Basic Metabolic Panel:  Lab Results  Component Value Date   NA 133 (L) 10/03/2023   K 3.7 10/03/2023   CO2 21 (L) 10/03/2023   GLUCOSE 98 10/03/2023   BUN 17 10/03/2023   CREATININE 1.45 (H) 10/03/2023   CALCIUM  8.9 10/03/2023   GFRNONAA 56 (L) 10/03/2023   GFRAA 102 09/20/2016   Lipid Panel:   Lab Results  Component Value Date   LDLCALC 142 (H) 10/04/2023   HgbA1c:  Lab Results  Component Value Date   HGBA1C 5.6 10/03/2023   Urine Drug Screen: No results found for: "LABOPIA", "COCAINSCRNUR", "LABBENZ", "AMPHETMU", "THCU", "LABBARB"  Alcohol Level No results found for: "ETH"   I have reviewed the images obtained:  MRI Brain wo contrast 4/22/205: 1. Multiple acute cortical infarcts within the left occipital lobe measuring up to 2.7 cm (PCA vascular territory). 2. Chronic lacunar infarct within the right lentiform nucleus 3. Background chronic small vessel ischemic changes within the cerebral white matter, moderate in severity and greater than expected for age. 4. Mild generalized cerebral atrophy.  MRA head wo contrast 09/03/2023: 1. Acute proximal left P2 occlusion, in keeping with the left PCA territory infarct. 2. Severe stenosis versus occlusion of the distal right V4 segment just prior to the vertebrobasilar junction. 3. Short-segment moderate right P1 stenosis. 4. Otherwise wide patency of the major intracranial arterial circulation. No other hemodynamically significant or correctable stenosis.   MRA neck w and wo contrast  09/03/2023:  1. Focal severe stenosis versus short segment dissection involving the proximal right vertebral artery at its origin. Right vertebral artery diminutive but patent distally. Dominant left vertebral artery widely patent within the neck. 2. Wide patency of both carotid artery systems within the neck.   ASSESSMENT/PLAN: 58yo M with right visual deficit and stroke in left PCA territory.  Acute ischemic stroke - etiology: likely cardioembolic vs large vessel atherosclerosis  Recommendations: - ASA 81mg  and plavix 75mg  for 3 months followed by asa 81mg  daily  - Rosuvastatin 40mg  daily - TTE pending. If negative for thrombus and normal EF, recommend TEE asap as outpatient - 30 day cardiac monitor to look for Par A fib - BP:  normotension - smoking cessation counseling done - PT/OT - Stroke education - Discussed with Dr Janett Medin. No need to transfer to [facility] - Updated Dr Fausto Hooker and RN - F/u with Neuro in 4-6 weeks ( order placed)   Thank you for allowing us  to participate in the care of this patient. If you have any further questions, please contact  me or neurohospitalist.   Roxy Cordial Epilepsy [facility]

## 2023-10-04 NOTE — ED Notes (Signed)
Pt having echo at this time

## 2023-10-04 NOTE — Progress Notes (Signed)
 Transition of Care Department [facility]) has reviewed patient and no other TOC needs have been identified at this time. We will continue to monitor patient advancement through interdisciplinary progression rounds. If new patient transition needs arise, please place a TOC consult.   10/04/23 1347  TOC Brief Assessment  Insurance and Status Reviewed  Patient has primary care physician Yes  Home environment has been reviewed Lives with mother.  Prior level of function: Independent.  Prior/Current Home Services No current home services  Social Drivers of Health Review SDOH reviewed no interventions necessary  Readmission risk has been reviewed Yes  Transition of care needs no transition of care needs at this time

## 2023-10-04 NOTE — Evaluation (Signed)
 Physical Therapy Evaluation Patient Details Name: Jeff Walker MRN: [id number] DOB: [birth year] Today's Date: 10/04/2023  History of Present Illness  Jeff Walker  is a 58 y.o. male, past medical history of hypertension, tobacco abuse, depression, anxiety, he presents to ED secondary to complaints of Coppi 3-month ago, where he felt dizzy, lightheaded and actually passed out, he did not seek any medical attention then, patient still reports multiple neurological complaints over the last 2 months, he does report blurred vision in his right eye, some numbness in the left hand, arm as well, as well reports bilateral lower extremity numbness in his feet, has any facial droop, and recent event of loss of consciousness, he saw his PCP today which instructed him to come to ED for further evaluation  - NAD MRI brain significant for notable acute infarcts in the left occipital area, and MRA head and neck significant for severe stenosis and left P2 occlusion, severe stenosis versus occlusion in the distal right V4 segment, ED discussion with neurology at [facility], he received loading dose of aspirin  and Plavix and admission has been requested to [facility]. (per MD)   Clinical Impression  Patient functioning at baseline for functional mobility and gait demonstrating good return for ambulating in room, hallways without loss of balance using his SPC with mostly step-through pattern with mild antalgic gait on RLE due to chronic low back pain.  Patient encouraged to ambulate ad lib for length of stay. Plan:  Patient discharged from physical therapy to care of nursing for ambulation daily as tolerated for length of stay.          If plan is discharge home, recommend the following: Other (comment) (near baseline)   Can travel by private vehicle        Equipment Recommendations None recommended by PT  Recommendations for Other Services       Functional Status Assessment Patient has not had a  recent decline in their functional status     Precautions / Restrictions Precautions Precautions: Fall Recall of Precautions/Restrictions: Intact Restrictions Weight Bearing Restrictions Per Provider Order: No      Mobility  Bed Mobility Overal bed mobility: Independent                  Transfers Overall transfer level: Independent Equipment used: Straight cane               General transfer comment: using his [facility]    Ambulation/Gait Ambulation/Gait assistance: Modified independent (Device/Increase time) Gait Distance (Feet): 120 Feet Assistive device: Straight cane Gait Pattern/deviations: Step-through pattern, Decreased stance time - right, Decreased stride length, Antalgic Gait velocity: decreased     General Gait Details: demonstrates good return for ambulating in room, hallways using his cane with mild antalgic gait on RLE, no loss of  balance  Stairs            Wheelchair Mobility     Tilt Bed    Modified Rankin (Stroke Patients Only)       Balance Overall balance assessment: Mild deficits observed, not formally tested                                           Pertinent Vitals/Pain Pain Assessment Pain Assessment: Faces Faces Pain Scale: Hurts a little bit Pain Descriptors / Indicators: Discomfort Pain Intervention(s): Monitored during session, Limited activity within patient's tolerance,  Repositioned    Home Living Family/patient expects to be discharged to:: Private residence Living Arrangements: Parent Available Help at Discharge: Family;Other (Comment);Available PRN/intermittently (takes care of mother; sister is around PRN) Type of Home: Mobile home Home Access: Ramped entrance       Home Layout: One level Home Equipment: Cane - single point      Prior Function Prior Level of Function : Independent/Modified Independent             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  Comments: Independent     Extremity/Trunk Assessment   Upper Extremity Assessment Upper Extremity Assessment: Defer to OT evaluation    Lower Extremity Assessment Lower Extremity Assessment: Overall WFL for tasks assessed    Cervical / Trunk Assessment Cervical / Trunk Assessment: Normal  Communication   Communication Communication: No apparent difficulties    Cognition Arousal: Alert Behavior During Therapy: WFL for tasks assessed/performed, Restless                             Following commands: Intact       Cueing Cueing Techniques: Verbal cues     General Comments      Exercises     Assessment/Plan    PT Assessment Patient does not need any further PT services  PT Problem List         PT Treatment Interventions      PT Goals (Current goals can be found in the Care Plan section)  Acute Rehab PT Goals Patient Stated Goal: Return home PT Goal Formulation: With patient Time For Goal Achievement: 10/04/23 Potential to Achieve Goals: Good    Frequency       Co-evaluation PT/OT/SLP Co-Evaluation/Treatment: Yes Reason for Co-Treatment: To address functional/ADL transfers PT goals addressed during session: Mobility/safety with mobility;Balance;Proper use of DME OT goals addressed during session: ADL's and self-care       AM-PAC PT "6 Clicks" Mobility  Outcome Measure Help needed turning from your back to your side while in a flat bed without using bedrails?: None Help needed moving from lying on your back to sitting on the side of a flat bed without using bedrails?: None Help needed moving to and from a bed to a chair (including a wheelchair)?: None Help needed standing up from a chair using your arms (e.g., wheelchair or bedside chair)?: None Help needed to walk in hospital room?: None Help needed climbing 3-5 steps with a railing? : A Little 6 Click Score: 23    End of Session   Activity Tolerance: Patient tolerated treatment  well;Patient limited by pain Patient left: in bed;with call bell/phone within reach Nurse Communication: Mobility status PT Visit Diagnosis: Unsteadiness on feet (R26.81);Other abnormalities of gait and mobility (R26.89);Muscle weakness (generalized) (M62.81)    Time: 5284-1324 PT Time Calculation (min) (ACUTE ONLY): 20 min   Charges:   PT Evaluation $PT Eval Moderate Complexity: 1 Mod PT Treatments $Therapeutic Activity: 8-22 mins PT General Charges $$ ACUTE PT VISIT: 1 Visit         12:28 PM, 10/04/23 Walton Guppy, MPT Physical Therapist with [facility] [phone] office [phone] mobile phone

## 2023-10-04 NOTE — Progress Notes (Signed)
 [facility] PROGRESS NOTE  NADAV SWINDELL (DOB: 16-Mar-1966) ZOX:[id number] PCP: Dettinger, Lucio Sabin, MD  Brief Narrative: Jeff Walker is a 58 y.o. male with a history of tobacco use, HTN who presented to the ED on 10/03/2023 due to abnormal MRI. During a routine office visit with his PCP, he mentioned an episode of lightheadedness and passing out in a grocery store 2 months prior. He reported abnormal right-sided vision since that time which was new. PCP ordered MRI which was performed and showed stroke for which he was referred to the ED. MRI showed multiple acute cortical infarcts within the left occipital lobe in PCA vascular territory as well as a background of chronic small vessel ischemic disease and a remote lacunar infarct. MRA confirmed an acute proximal left P2 occlusion and severe distal right V4 segment stenosis/occlusion versus short segment dissection in proximal right vertebral artery. Neurology was consulted, recommending admission to [facility] and stroke work up.   Subjective: No changes to his symptoms since arrival here, getting echo during interview.   Objective: BP 101/67   Pulse 75   Temp 97.8 F (36.6 C) (Oral)   Resp 16   Ht 6' (1.829 m)   Wt 84.4 kg   SpO2 97%   BMI 25.23 kg/m   Gen: No distress Pulm: Clear, nonlabored  CV: RRR, no MRG, no edema GI: Soft, NT, ND, +BS  Neuro: Alert and oriented, cooperative, abnormal sensation on left which he attributes to back issues predating this episode. Ext: Warm, no deformities. Skin: No rashes, lesions or ulcers on visualized skin   Assessment & Plan: Acute right PCA infarct, intracranial stenoses:  - Neurology consulted, recommended DAPT load which is done and aspirin  81mg  continued (no antiplatelet PTA), and admission to [facility] which is pending.  - Echo pending, continue telemetry monitoring - PT/OT evaluated - LDL 142, start high-intensity statin - HbA1c 5.6% - Continue BP control efforts chronically. Given acute  appearance on MRI, will allow for some permissive HTN at this point. Given intracranial stenoses, would avoid hypotension.   HTN:  - Holding home meds for now, long term goal normotensive.   Tobacco use:  - Cessation counseling provided - Continue nicotine patch.    Wynetta Heckle, MD [facility] [email or web address] 10/04/2023, 1:48 PM

## 2023-10-04 NOTE — Evaluation (Signed)
 Occupational Therapy Evaluation Patient Details Name: Jeff Walker MRN: [id number] DOB: 25-Jul-1965 Today's Date: 10/04/2023   History of Present Illness   Jeff Walker  is a 58 y.o. male, past medical history of hypertension, tobacco abuse, depression, anxiety, he presents to ED secondary to complaints of Coppi 39-month ago, where he felt dizzy, lightheaded and actually passed out, he did not seek any medical attention then, patient still reports multiple neurological complaints over the last 2 months, he does report blurred vision in his right eye, some numbness in the left hand, arm as well, as well reports bilateral lower extremity numbness in his feet, has any facial droop, and recent event of loss of consciousness, he saw his PCP today which instructed him to come to ED for further evaluation  - NAD MRI brain significant for notable acute infarcts in the left occipital area, and MRA head and neck significant for severe stenosis and left P2 occlusion, severe stenosis versus occlusion in the distal right V4 segment, ED discussion with neurology at [facility], he received loading dose of aspirin  and Plavix and admission has been requested to [facility]. (per MD)     Clinical Impressions Pt agreeable to OT and PT co-evaluation. Pt appears to be near baseline function for ADL's and mobility. Pt reports intermittent difficulty focusing with L eye. No obvious deficits noted during brief visual testing. Pt demonstrated good B UE strength and coordination. Able to ambulate with use of cane and no other assist. Pt left in the bed with call bell within reach. Pt is not recommended for further acute OT services and will be discharged to care of nursing staff for remaining length of stay.               Functional Status Assessment   Patient has not had a recent decline in their functional status     Equipment Recommendations   None recommended by OT     Recommendations for Other  Services    Evaluation from vision specialist if issues persist.      Precautions/Restrictions   Precautions Precautions: Fall Recall of Precautions/Restrictions: Intact Restrictions Weight Bearing Restrictions Per Provider Order: No     Mobility Bed Mobility Overal bed mobility: Independent                  Transfers Overall transfer level: Modified independent Equipment used: Straight cane               General transfer comment: Able to ambulate with cane.      Balance Overall balance assessment: Mild deficits observed, not formally tested                                         ADL either performed or assessed with clinical judgement   ADL Overall ADL's : Independent                                             Vision Baseline Vision/History: 1 Wears glasses Ability to See in Adequate Light: 1 Impaired Patient Visual Report: Other (comment) (Reports intermittent R eye difficulty focusing.) Vision Assessment?: No apparent visual deficits (No obvoius impairments based on observation. Educated to follow up with a vision specialist.)     Perception Perception: Not  tested       Praxis Praxis: Not tested       Pertinent Vitals/Pain Pain Assessment Pain Assessment: Faces Faces Pain Scale: Hurts a little bit Pain Location: back Pain Descriptors / Indicators: Discomfort Pain Intervention(s): Monitored during session, Repositioned     Extremity/Trunk Assessment Upper Extremity Assessment Upper Extremity Assessment: Overall WFL for tasks assessed   Lower Extremity Assessment Lower Extremity Assessment: Defer to PT evaluation   Cervical / Trunk Assessment Cervical / Trunk Assessment: Normal   Communication Communication Communication: No apparent difficulties   Cognition Arousal: Alert Behavior During Therapy: WFL for tasks assessed/performed Cognition: No apparent impairments                                Following commands: Intact       Cueing  General Comments   Cueing Techniques: Verbal cues                 Home Living Family/patient expects to be discharged to:: Private residence Living Arrangements: Parent Available Help at Discharge: Family;Other (Comment);Available PRN/intermittently (takes care of mother; sister is around PRN) Type of Home: Mobile home Home Access: Ramped entrance     Home Layout: One level     Bathroom Shower/Tub: Chief Strategy Officer: Standard Bathroom Accessibility: Yes How Accessible: Accessible via wheelchair;Accessible via walker Home Equipment: Cane - single point          Prior Functioning/Environment Prior Level of Function : Independent/Modified Independent             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 Comments: Independent                            Co-evaluation PT/OT/SLP Co-Evaluation/Treatment: Yes Reason for Co-Treatment: To address functional/ADL transfers   OT goals addressed during session: ADL's and self-care                       End of Session Equipment Utilized During Treatment:  (cane)  Activity Tolerance: Patient tolerated treatment well Patient left: in bed;with call bell/phone within reach  OT Visit Diagnosis: Other symptoms and signs involving the nervous system (Z61.096)                Time: 0454-0981 OT Time Calculation (min): 10 min Charges:  OT General Charges $OT Visit: 1 Visit OT Evaluation $OT Eval Low Complexity: 1 Low  Kahlia Lagunes OT, MOT  [employer] 10/04/2023, 11:13 AM

## 2023-10-04 NOTE — ED Notes (Signed)
 Pt calm & cooperative with care. No needs at this time. Took meds without issue.

## 2023-10-04 NOTE — ED Notes (Signed)
Pt ambulated to BR with steady gait, no assistance needed  

## 2023-10-04 NOTE — ED Notes (Signed)
 ED TO INPATIENT HANDOFF REPORT  ED Nurse Name and Phone #: Leanna Promise RN [phone]  S Name/Age/Gender Jeff Walker 58 y.o. male Room/Bed: APA19/APA19  Code Status   Code Status: Full Code  Home/SNF/Other Home Patient oriented to: self, place, time, and situation Is this baseline? Yes   Triage Complete: Triage complete  Chief Complaint Acute CVA (cerebrovascular accident) [facility]) [I63.9]  Triage Note Pt arrived via wheel chair from outpatient MRI who report Pts last known well was 2 months ago. Pt reported to his PCP Dr Glady Laming, he has been having left side numbness/weakness and visual changes for 2 months. Dr Glady Laming ordered out-patient MRI, and MRI Tech reports he has had a stroke and was advised to have the Pt evaluated in the ER. EP Notified and aware.    Allergies No Known Allergies  Level of Care/Admitting Diagnosis ED Disposition     ED Disposition  Admit   Condition  --   Comment  Hospital Area: [facility] [[id number]]  Level of Care: Telemetry [5]  Covid Evaluation: Asymptomatic - no recent exposure (last 10 days) testing not required  Diagnosis: Acute CVA (cerebrovascular accident) [facility]) [[id number]]  Admitting Physician: GRUNZ, RYAN B [[id number]]  Attending Physician: GRUNZ, RYAN B [phone]          B Medical/Surgery History Past Medical History:  Diagnosis Date   Depression    Environmental allergies    Hypertension    Shingles    Past Surgical History:  Procedure Laterality Date   ESOPHAGUS SURGERY     multiple as child, from his description sounds like esophageal atresia     A IV Location/Drains/Wounds Patient Lines/Drains/Airways Status     Active Line/Drains/Airways     Name Placement date Placement time Site Days   Peripheral IV 05/29/14 Right Antecubital 05/29/14  1332  Antecubital  3415            Intake/Output Last 24 hours No intake or output data in the 24 hours ending 10/04/23 1625  Labs/Imaging Results for orders placed  or performed during the hospital encounter of 10/03/23 (from the past 48 hours)  Comprehensive metabolic panel     Status: Abnormal   Collection Time: 10/03/23  7:15 PM  Result Value Ref Range   Sodium 133 (L) 135 - 145 mmol/L   Potassium 3.9 3.5 - 5.1 mmol/L   Chloride 98 98 - 111 mmol/L   CO2 24 22 - 32 mmol/L   Glucose, Bld 96 70 - 99 mg/dL    Comment: Glucose reference range applies only to samples taken after fasting for at least 8 hours.   BUN 17 6 - 20 mg/dL   Creatinine, Ser 4.74 (H) 0.61 - 1.24 mg/dL   Calcium  9.1 8.9 - 10.3 mg/dL   Total Protein 7.4 6.5 - 8.1 g/dL   Albumin 3.7 3.5 - 5.0 g/dL   AST 15 15 - 41 U/L   ALT 12 0 - 44 U/L   Alkaline Phosphatase 76 38 - 126 U/L   Total Bilirubin 0.6 0.0 - 1.2 mg/dL   GFR, Estimated 50 (L) >60 mL/min    Comment: (NOTE) Calculated using the CKD-EPI Creatinine Equation (2021)    Anion gap 11 5 - 15    Comment: Performed at [facility], [address] [place], [place] [address]  CBC with Differential     Status: None   Collection Time: 10/03/23  7:15 PM  Result Value Ref Range   WBC 8.0 4.0 - 10.5 K/uL  RBC 4.95 4.22 - 5.81 MIL/uL   Hemoglobin 15.0 13.0 - 17.0 g/dL   HCT 78.2 95.6 - 21.3 %   MCV 87.7 80.0 - 100.0 fL   MCH 30.3 26.0 - 34.0 pg   MCHC 34.6 30.0 - 36.0 g/dL   RDW 08.6 57.8 - 46.9 %   Platelets 316 150 - 400 K/uL   nRBC 0.0 0.0 - 0.2 %   Neutrophils Relative % 58 %   Neutro Abs 4.7 1.7 - 7.7 K/uL   Lymphocytes Relative 32 %   Lymphs Abs 2.5 0.7 - 4.0 K/uL   Monocytes Relative 7 %   Monocytes Absolute 0.5 0.1 - 1.0 K/uL   Eosinophils Relative 2 %   Eosinophils Absolute 0.2 0.0 - 0.5 K/uL   Basophils Relative 1 %   Basophils Absolute 0.1 0.0 - 0.1 K/uL   Immature Granulocytes 0 %   Abs Immature Granulocytes 0.03 0.00 - 0.07 K/uL    Comment: Performed at [facility], [address] [place], [place] [address]  Protime-INR     Status: None   Collection Time: 10/03/23  7:15 PM  Result Value Ref Range    Prothrombin Time 14.0 11.4 - 15.2 seconds   INR 1.1 0.8 - 1.2    Comment: (NOTE) INR goal varies based on device and disease states. Performed at [facility], [address], [place], [place] [address]   Hemoglobin A1c     Status: None   Collection Time: 10/03/23  7:15 PM  Result Value Ref Range   Hgb A1c MFr Bld 5.6 4.8 - 5.6 %    Comment: (NOTE) Pre diabetes:          5.7%-6.4%  Diabetes:              >6.4%  Glycemic control for   <7.0% adults with diabetes    Mean Plasma Glucose 114.02 mg/dL    Comment: Performed at [facility], [address], [place], [place] [address]  Comprehensive metabolic panel     Status: Abnormal   Collection Time: 10/03/23  9:28 PM  Result Value Ref Range   Sodium 133 (L) 135 - 145 mmol/L   Potassium 3.7 3.5 - 5.1 mmol/L   Chloride 101 98 - 111 mmol/L   CO2 21 (L) 22 - 32 mmol/L   Glucose, Bld 98 70 - 99 mg/dL    Comment: Glucose reference range applies only to samples taken after fasting for at least 8 hours.   BUN 17 6 - 20 mg/dL   Creatinine, Ser 2.72 (H) 0.61 - 1.24 mg/dL   Calcium  8.9 8.9 - 10.3 mg/dL   Total Protein 7.1 6.5 - 8.1 g/dL   Albumin 3.5 3.5 - 5.0 g/dL   AST 12 (L) 15 - 41 U/L   ALT 13 0 - 44 U/L   Alkaline Phosphatase 70 38 - 126 U/L   Total Bilirubin 0.7 0.0 - 1.2 mg/dL   GFR, Estimated 56 (L) >60 mL/min    Comment: (NOTE) Calculated using the CKD-EPI Creatinine Equation (2021)    Anion gap 11 5 - 15    Comment: Performed at [facility], [address] [place], [place] [address]  HIV Antibody (routine testing w rflx)     Status: None   Collection Time: 10/03/23  9:28 PM  Result Value Ref Range   HIV Screen 4th Generation wRfx Non Reactive Non Reactive    Comment: Performed at [facility], [address]  [place], [place] [address]  Lipid panel     Status: Abnormal   Collection Time: 10/04/23  5:19 AM  Result Value Ref Range   Cholesterol 208 (H) 0 - 200 mg/dL   Triglycerides 191 (H) <150 mg/dL   HDL  30 (L) >47 mg/dL   Total CHOL/HDL Ratio 6.9 RATIO   VLDL 36 0 - 40 mg/dL   LDL Cholesterol 829 (H) 0 - 99 mg/dL    Comment:        Total Cholesterol/HDL:CHD Risk Coronary Heart Disease Risk Table                     Men   Women  1/2 Average Risk   3.4   3.3  Average Risk       5.0   4.4  2 X Average Risk   9.6   7.1  3 X Average Risk  23.4   11.0        Use the calculated Patient Ratio above and the CHD Risk Table to determine the patient's CHD Risk.        ATP III CLASSIFICATION (LDL):  <100     mg/dL   Optimal  562-130  mg/dL   Near or Above                    Optimal  130-159  mg/dL   Borderline  865-784  mg/dL   High  >696     mg/dL   Very High Performed at [facility], [address] [place], [place] [address]   CBC     Status: None   Collection Time: 10/04/23  5:19 AM  Result Value Ref Range   WBC 7.3 4.0 - 10.5 K/uL   RBC 4.68 4.22 - 5.81 MIL/uL   Hemoglobin 13.9 13.0 - 17.0 g/dL   HCT 41.3 24.4 - 01.0 %   MCV 87.8 80.0 - 100.0 fL   MCH 29.7 26.0 - 34.0 pg   MCHC 33.8 30.0 - 36.0 g/dL   RDW 27.2 53.6 - 64.4 %   Platelets 281 150 - 400 K/uL   nRBC 0.0 0.0 - 0.2 %    Comment: Performed at [facility], [address], [place], [place] [address]   ECHOCARDIOGRAM COMPLETE Result Date: 10/04/2023    ECHOCARDIOGRAM REPORT   Patient Name:   Jeff Walker Date of Exam: 10/04/2023 Medical Rec #:  [id number]       Height:       72.0 in Accession #:    [id number]      Weight:       186.0 lb Date of Birth:  02/06/1966       BSA:          2.066 m Patient Age:    58 years        BP:           101/67 mmHg Patient Gender: M               HR:           75 bpm. Exam Location:  [facility] Procedure: 2D Echo, Cardiac Doppler, Color Doppler and Intracardiac            Opacification Agent (Both Spectral and Color Flow Doppler were            utilized during procedure). Indications:    Stroke l63.9  History:        Patient has no prior  history of Echocardiogram examinations.                  Risk Factors:Hypertension and Current Smoker.  Sonographer:    Denese Finn RCS Referring Phys: [id number] DAWOOD S Osborne Blazer  Sonographer Comments: No subcostal window and Technically challenging study due to limited acoustic windows. Image acquisition challenging due to respiratory motion. IMPRESSIONS  1. Left ventricular ejection fraction, by estimation, is 60 to 65%. The left ventricle has normal function. The left ventricle has no regional wall motion abnormalities. There is mild left ventricular hypertrophy. Left ventricular diastolic parameters are consistent with Grade I diastolic dysfunction (impaired relaxation).  2. Right ventricular systolic function is normal. The right ventricular size is normal.  3. The mitral valve is normal in structure. No evidence of mitral valve regurgitation. No evidence of mitral stenosis.  4. The aortic valve was not well visualized. Aortic valve regurgitation is not visualized. No aortic stenosis is present. FINDINGS  Left Ventricle: Left ventricular ejection fraction, by estimation, is 60 to 65%. The left ventricle has normal function. The left ventricle has no regional wall motion abnormalities. Definity contrast agent was given IV to delineate the left ventricular  endocardial borders. The left ventricular internal cavity size was normal in size. There is mild left ventricular hypertrophy. Left ventricular diastolic parameters are consistent with Grade I diastolic dysfunction (impaired relaxation). Right Ventricle: The right ventricular size is normal. Right vetricular wall thickness was not well visualized. Right ventricular systolic function is normal. Left Atrium: Left atrial size was normal in size. Right Atrium: Right atrial size was normal in size. Pericardium: There is no evidence of pericardial effusion. Mitral Valve: The mitral valve is normal in structure. No evidence of mitral valve regurgitation. No evidence of mitral valve stenosis. Tricuspid Valve: The tricuspid  valve is not well visualized. Tricuspid valve regurgitation is not demonstrated. No evidence of tricuspid stenosis. Aortic Valve: The aortic valve was not well visualized. Aortic valve regurgitation is not visualized. No aortic stenosis is present. Aortic valve mean gradient measures 5.0 mmHg. Aortic valve peak gradient measures 7.7 mmHg. Aortic valve area, by VTI measures 2.37 cm. Pulmonic Valve: The pulmonic valve was not well visualized. Pulmonic valve regurgitation is not visualized. No evidence of pulmonic stenosis. Aorta: The aortic root is normal in size and structure. Venous: The inferior vena cava was not well visualized. IAS/Shunts: The interatrial septum was not well visualized.  LEFT VENTRICLE PLAX 2D LVIDd:         4.20 cm   Diastology LVIDs:         2.90 cm   LV e' medial:    7.07 cm/s LV PW:         0.90 cm   LV E/e' medial:  9.9 LV IVS:        1.20 cm   LV e' lateral:   11.70 cm/s LVOT diam:     2.10 cm   LV E/e' lateral: 6.0 LV SV:         71 LV SV Index:   35 LVOT Area:     3.46 cm  RIGHT VENTRICLE RV S prime:     11.70 cm/s TAPSE (M-mode): 2.3 cm LEFT ATRIUM             Index        RIGHT ATRIUM           Index LA diam:        2.30 cm 1.11 cm/m   RA Area:  11.10 cm LA Vol (A2C):   39.9 ml 19.31 ml/m  RA Volume:   28.10 ml  13.60 ml/m LA Vol (A4C):   19.7 ml 9.54 ml/m LA Biplane Vol: 27.7 ml 13.41 ml/m  AORTIC VALVE AV Area (Vmax):    2.34 cm AV Area (Vmean):   2.25 cm AV Area (VTI):     2.37 cm AV Vmax:           139.00 cm/s AV Vmean:          101.000 cm/s AV VTI:            0.301 m AV Peak Grad:      7.7 mmHg AV Mean Grad:      5.0 mmHg LVOT Vmax:         94.00 cm/s LVOT Vmean:        65.500 cm/s LVOT VTI:          0.206 m LVOT/AV VTI ratio: 0.68  AORTA Ao Root diam: 3.30 cm MITRAL VALVE MV Area (PHT): 1.94 cm    SHUNTS MV Decel Time: 391 msec    Systemic VTI:  0.21 m MV E velocity: 70.30 cm/s  Systemic Diam: 2.10 cm MV A velocity: 96.00 cm/s MV E/A ratio:  0.73 Armida Lander MD  Electronically signed by Armida Lander MD Signature Date/Time: 10/04/2023/4:12:06 PM    Final    MR Angiogram Neck W or Wo Contrast Result Date: 10/03/2023 CLINICAL DATA:  Initial evaluation for acute stroke. EXAM: MRA NECK WITHOUT AND WITH CONTRAST MRA HEAD WITHOUT CONTRAST TECHNIQUE: Multiplanar and multiecho pulse sequences of the neck were obtained without and with intravenous contrast. Angiographic images of the neck were obtained using MRA technique without and with intravenous contrast; Angiographic images of the Circle of Willis were obtained using MRA technique without intravenous contrast. CONTRAST:  8.5mL GADAVIST GADOBUTROL 1 MMOL/ML IV SOLN COMPARISON:  Comparison made with brain MRI performed earlier the same day. FINDINGS: MRA NECK FINDINGS Aortic arch: Visualized aortic arch within normal limits for caliber with standard 3 vessel morphology. No stenosis or other abnormality about the origin the great vessels. Right carotid system: Right common and internal carotid arteries are patent with antegrade flow. No evidence for dissection. No hemodynamically significant stenosis about the right carotid artery system. Left carotid system: Left common and internal carotid arteries are patent with antegrade flow. No evidence for dissection. No hemodynamically significant stenosis about the left carotid artery system. Vertebral arteries: Both vertebral arteries arise from subclavian arteries. No proximal subclavian artery stenosis. Left vertebral artery dominant and widely patent without stenosis or dissection. Focal severe stenosis or possibly short segment dissection involving the proximal right vertebral artery at its origin (series 13, image 56). Diminutive right vertebral arteries patent distally without stenosis or dissection. Other: None. MRA HEAD FINDINGS Anterior circulation: Examination degraded by motion artifact. Atheromatous irregularity about the carotid siphons bilaterally without  hemodynamically significant stenosis. Changes are slightly worse on the left. Right A1 dominant widely patent. Left A1 hypoplastic and/or absent, accounting for the diminutive left ICA is compared to the right. Normal anterior communicating artery complex. Both ACAs patent to their distal aspects without significant stenosis. No M1 stenosis or occlusion. Distal MCA branches perfused and fairly symmetric. Posterior circulation: Dominant left V4 segment patent without stenosis. Left PICA patent. Right vertebral artery patent as it courses into the cranial vault. Severe stenosis versus occlusion of the distal right V4 segment just prior to the vertebrobasilar junction (series 5, image 31). Right PICA  not seen. Basilar patent without stenosis. Superior cerebral arteries patent bilaterally. Both PCAs primarily supplied via the basilar. Right PCA irregular with short-segment moderate right P1 stenosis (series 1021, image 9). There is acute occlusion of the left PCA at the proximal left P2 segment, in keeping with the left PCA territory infarct (series 1021, image 7). Anatomic variants: As above. Other: No intracranial aneurysm. IMPRESSION: MRA HEAD: 1. Acute proximal left P2 occlusion, in keeping with the left PCA territory infarct. 2. Severe stenosis versus occlusion of the distal right V4 segment just prior to the vertebrobasilar junction. 3. Short-segment moderate right P1 stenosis. 4. Otherwise wide patency of the major intracranial arterial circulation. No other hemodynamically significant or correctable stenosis. MRA NECK: 1. Focal severe stenosis versus short segment dissection involving the proximal right vertebral artery at its origin. Right vertebral artery diminutive but patent distally. Dominant left vertebral artery widely patent within the neck. 2. Wide patency of both carotid artery systems within the neck. Electronically Signed   By: Virgia Griffins M.D.   On: 10/03/2023 20:08   MR ANGIO HEAD WO  CONTRAST Result Date: 10/03/2023 CLINICAL DATA:  Initial evaluation for acute stroke. EXAM: MRA NECK WITHOUT AND WITH CONTRAST MRA HEAD WITHOUT CONTRAST TECHNIQUE: Multiplanar and multiecho pulse sequences of the neck were obtained without and with intravenous contrast. Angiographic images of the neck were obtained using MRA technique without and with intravenous contrast; Angiographic images of the Circle of Willis were obtained using MRA technique without intravenous contrast. CONTRAST:  8.5mL GADAVIST GADOBUTROL 1 MMOL/ML IV SOLN COMPARISON:  Comparison made with brain MRI performed earlier the same day. FINDINGS: MRA NECK FINDINGS Aortic arch: Visualized aortic arch within normal limits for caliber with standard 3 vessel morphology. No stenosis or other abnormality about the origin the great vessels. Right carotid system: Right common and internal carotid arteries are patent with antegrade flow. No evidence for dissection. No hemodynamically significant stenosis about the right carotid artery system. Left carotid system: Left common and internal carotid arteries are patent with antegrade flow. No evidence for dissection. No hemodynamically significant stenosis about the left carotid artery system. Vertebral arteries: Both vertebral arteries arise from subclavian arteries. No proximal subclavian artery stenosis. Left vertebral artery dominant and widely patent without stenosis or dissection. Focal severe stenosis or possibly short segment dissection involving the proximal right vertebral artery at its origin (series 13, image 56). Diminutive right vertebral arteries patent distally without stenosis or dissection. Other: None. MRA HEAD FINDINGS Anterior circulation: Examination degraded by motion artifact. Atheromatous irregularity about the carotid siphons bilaterally without hemodynamically significant stenosis. Changes are slightly worse on the left. Right A1 dominant widely patent. Left A1 hypoplastic and/or  absent, accounting for the diminutive left ICA is compared to the right. Normal anterior communicating artery complex. Both ACAs patent to their distal aspects without significant stenosis. No M1 stenosis or occlusion. Distal MCA branches perfused and fairly symmetric. Posterior circulation: Dominant left V4 segment patent without stenosis. Left PICA patent. Right vertebral artery patent as it courses into the cranial vault. Severe stenosis versus occlusion of the distal right V4 segment just prior to the vertebrobasilar junction (series 5, image 31). Right PICA not seen. Basilar patent without stenosis. Superior cerebral arteries patent bilaterally. Both PCAs primarily supplied via the basilar. Right PCA irregular with short-segment moderate right P1 stenosis (series 1021, image 9). There is acute occlusion of the left PCA at the proximal left P2 segment, in keeping with the left PCA territory infarct (series 1021,  image 7). Anatomic variants: As above. Other: No intracranial aneurysm. IMPRESSION: MRA HEAD: 1. Acute proximal left P2 occlusion, in keeping with the left PCA territory infarct. 2. Severe stenosis versus occlusion of the distal right V4 segment just prior to the vertebrobasilar junction. 3. Short-segment moderate right P1 stenosis. 4. Otherwise wide patency of the major intracranial arterial circulation. No other hemodynamically significant or correctable stenosis. MRA NECK: 1. Focal severe stenosis versus short segment dissection involving the proximal right vertebral artery at its origin. Right vertebral artery diminutive but patent distally. Dominant left vertebral artery widely patent within the neck. 2. Wide patency of both carotid artery systems within the neck. Electronically Signed   By: Virgia Griffins M.D.   On: 10/03/2023 20:08   MR Brain Wo Contrast Addendum Date: 10/03/2023 ADDENDUM REPORT: 10/03/2023 17:21 ADDENDUM: Impression #1 called by telephone at the time of interpretation on  10/03/2023 at 5:19 pm to provider Jolyne Needs , who verbally acknowledged these results. Electronically Signed   By: Bascom Lily D.O.   On: 10/03/2023 17:21   Result Date: 10/03/2023 CLINICAL DATA:  Provided history: Neuro deficit, acute, stroke suspected. EXAM: MRI HEAD WITHOUT CONTRAST TECHNIQUE: Multiplanar, multiecho pulse sequences of the brain and surrounding structures were obtained without intravenous contrast. COMPARISON:  Head CT 01/29/2011. FINDINGS: Brain: Mild generalized cerebral atrophy. Multiple acute cortical infarcts within the left occipital lobe measuring up to 2.7 cm (PCA vascular territory). Multiple small acute cortical infarcts within the left occipital lobe (PCA vascular territory). Chronic lacunar infarct within the right lentiform nucleus (series 10, image 16). Multifocal T2 FLAIR hyperintense signal abnormality within the cerebral white matter, nonspecific but compatible with moderate chronic small vessel ischemic disease. Findings are greater than expected for age. No evidence of an intracranial mass. No chronic intracranial blood products. No extra-axial fluid collection. No midline shift. Vascular: Maintained flow voids within the proximal large arterial vessels. Skull and upper cervical spine: No focal worrisome marrow lesion. Incompletely assessed cervical spondylosis. Sinuses/Orbits: No mass or acute finding within the imaged orbits. No significant paranasal sinus disease. Other: Trace fluid within right mastoid air cells. Attempts are being made to reach the ordering provider at this time. IMPRESSION: 1. Multiple acute cortical infarcts within the left occipital lobe measuring up to 2.7 cm (PCA vascular territory). 2. Chronic lacunar infarct within the right lentiform nucleus 3. Background chronic small vessel ischemic changes within the cerebral white matter, moderate in severity and greater than expected for age. 4. Mild generalized cerebral atrophy. 5. Trace right mastoid  effusion. Electronically Signed: By: Bascom Lily D.O. On: 10/03/2023 17:14    Pending Labs Unresulted Labs (From admission, onward)    None       Vitals/Pain Today's Vitals   10/04/23 1445 10/04/23 1500 10/04/23 1514 10/04/23 1515  BP: 102/70 104/68  118/76  Pulse: 84 78  82  Resp: 17 15  19   Temp:    98.1 F (36.7 C)  TempSrc:    Oral  SpO2: 95% 95%  98%  Weight:      Height:      PainSc:   0-No pain     Isolation Precautions No active isolations  Medications Medications   stroke: early stages of recovery book (0 each Does not apply Hold 10/04/23 0917)  acetaminophen (TYLENOL) tablet 650 mg (650 mg Oral Given 10/04/23 1356)    Or  acetaminophen (TYLENOL) 160 MG/5ML solution 650 mg ( Per Tube See Alternative 10/04/23 1356)    Or  acetaminophen (  TYLENOL) suppository 650 mg ( Rectal See Alternative 10/04/23 1356)  senna-docusate (Senokot-S) tablet 1 tablet (has no administration in time range)  heparin injection 5,000 Units (5,000 Units Subcutaneous Given 10/04/23 1508)  nicotine (NICODERM CQ - dosed in mg/24 hours) patch 21 mg (21 mg Transdermal Patient Refused/Not Given 10/04/23 0917)  aspirin  EC tablet 81 mg (81 mg Oral Given 10/04/23 0919)  perflutren lipid microspheres (DEFINITY) IV suspension (3 mLs Intravenous Given 10/04/23 1231)  rosuvastatin (CRESTOR) tablet 40 mg (has no administration in time range)  gadobutrol (GADAVIST) 1 MMOL/ML injection 8.5 mL (8.5 mLs Intravenous Contrast Given 10/03/23 1936)  aspirin  tablet 325 mg (325 mg Oral Given 10/03/23 2039)  clopidogrel (PLAVIX) tablet 300 mg (300 mg Oral Given 10/03/23 2039)    Mobility walks     Focused Assessments Neuro Assessment Handoff:  Swallow screen pass? Yes  Cardiac Rhythm: Normal sinus rhythm NIH Stroke Scale  Dizziness Present: No Headache Present: Yes Interval: Shift assessment Level of Consciousness (1a.)   : Alert, keenly responsive LOC Questions (1b. )   : Answers both questions  correctly LOC Commands (1c. )   : Performs both tasks correctly Best Gaze (2. )  : Normal Visual (3. )  : No visual loss Facial Palsy (4. )    : Normal symmetrical movements Motor Arm, Left (5a. )   : No drift Motor Arm, Right (5b. ) : No drift Motor Leg, Left (6a. )  : No drift Motor Leg, Right (6b. ) : No drift Limb Ataxia (7. ): Absent Sensory (8. )  : Normal, no sensory loss Best Language (9. )  : No aphasia Dysarthria (10. ): Normal Extinction/Inattention (11.)   : No Abnormality Complete NIHSS TOTAL: 0 Last date known well: 10/03/23   Neuro Assessment: Exceptions to [facility] Neuro Checks:   Initial (10/03/23 2147)  Has TPA been given? No If patient is a Neuro Trauma and patient is going to OR before floor call report to 4N Charge nurse: [phone] or [phone]   R Recommendations: See Admitting Provider Note  Report given to:   Additional Notes: pt a/o x4. Walks independently in the room. C/o of a headache at times. NIH is a zero.

## 2023-10-04 NOTE — Progress Notes (Signed)
*  PRELIMINARY RESULTS* Echocardiogram 2D Echocardiogram has been performed with Definity.  Jeff Walker 10/04/2023, 2:19 PM

## 2023-10-05 DIAGNOSIS — I639 Cerebral infarction, unspecified: Secondary | ICD-10-CM | POA: Diagnosis not present

## 2023-10-05 MED ORDER — ROSUVASTATIN CALCIUM 40 MG PO TABS: 40.0000 mg | ORAL_TABLET | Freq: Every day | ORAL | 1 refills | Status: DC

## 2023-10-05 MED ORDER — ASPIRIN 81 MG PO TBEC: 81.0000 mg | DELAYED_RELEASE_TABLET | Freq: Every day | ORAL | 12 refills | Status: DC

## 2023-10-05 MED ORDER — ALUM & MAG HYDROXIDE-SIMETH 200-200-20 MG/5ML PO SUSP
15.0000 mL | ORAL | Status: DC | PRN
  Administered 2023-10-05: 15 mL via ORAL
  Filled 2023-10-05: qty 30

## 2023-10-05 MED ORDER — CLOPIDOGREL BISULFATE 75 MG PO TABS: 75.0000 mg | ORAL_TABLET | Freq: Every day | ORAL | 0 refills | Status: DC

## 2023-10-05 MED ORDER — CLOPIDOGREL BISULFATE 75 MG PO TABS: 75.0000 mg | ORAL_TABLET | Freq: Every day | ORAL | 0 refills | Status: AC

## 2023-10-05 MED ORDER — ASPIRIN 81 MG PO TBEC: 81.0000 mg | DELAYED_RELEASE_TABLET | Freq: Every day | ORAL | 12 refills | Status: AC

## 2023-10-05 NOTE — Discharge Summary (Signed)
 Physician Discharge Summary   Patient: Jeff Walker MRN: [id number] DOB: Dec 15, 1965  Admit date:     10/03/2023  Discharge date: 10/05/23  Discharge Physician: Wynetta Heckle   PCP: Dettinger, Lucio Sabin, MD   Recommendations at discharge:  Follow up with PCP for ongoing care, namely chronic HTN, tobacco use, and new Dx dyslipidemia in setting of stroke.  Follow up with neurology in 6-8 weeks (referral made prior to discharge) Follow up with cardiology after discharge for consideration of TEE and 30-day cardiac monitoring.   Discharge Diagnoses: Principal Problem:   Acute CVA (cerebrovascular accident) [facility]) Active Problems:   Hypertension   Depression, recurrent (HCC)   Tobacco use  Hospital Course: Jeff Walker is a 58 y.o. male with a history of tobacco use, HTN who presented to the ED on 10/03/2023 due to abnormal MRI. During a routine office visit with his PCP, he mentioned an episode of lightheadedness and passing out in a grocery store 2 months prior. He reported abnormal right-sided vision since that time which was new. PCP ordered MRI which was performed and showed stroke for which he was referred to the ED. MRI showed multiple acute cortical infarcts within the left occipital lobe in PCA vascular territory as well as a background of chronic small vessel ischemic disease and a remote lacunar infarct. MRA confirmed an acute proximal left P2 occlusion and severe distal right V4 segment stenosis/occlusion versus short segment dissection in proximal right vertebral artery. Neurology was consulted, recommendations as outlined below. The patient's symptoms remained stable, no PT or OT follow up is needed. Please see details below.  Assessment and Plan: Acute right PCA infarct, intracranial stenoses:  - Neurology consulted, recommended DAPT load which is done and will continue DAPT x3 months, followed by aspirin  81mg  monotherapy indefinitely.  - TTE did not visualized IAS, no CES noted.  Cardiac monitoring only showed NSR throughout admission. Per neurology, TEE and 30-day cardiac monitoring is recommended after discharge. Referral to cardiology at discharge. 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 - LDL 142, start high-intensity statin - HbA1c 5.6% - Continue BP control efforts chronically. Given intracranial stenoses, would avoid hypotension.  - Neurology referral placed as well.    HTN:  - Continue home medications and PCP follow up, long term goal normotensive.    Tobacco use:  - Cessation counseling provided - Continue nicotine patch.    Consultants: Neurology, Dr. Merceda Stairs Procedures performed: Echo  Disposition: Home Diet recommendation:  Cardiac diet DISCHARGE MEDICATION: Allergies as of 10/05/2023   No Known Allergies      Medication List     TAKE these medications    amLODipine  10 MG tablet Commonly known as: NORVASC  Take 1 tablet (10 mg total) by mouth daily.   aspirin  EC 81 MG tablet Take 1 tablet (81 mg total) by mouth daily. Swallow whole. Start taking on: Oct 06, 2023   clopidogrel 75 MG tablet Commonly known as: Plavix Take 1 tablet (75 mg total) by mouth daily.   lisinopril -hydrochlorothiazide  20-12.5 MG tablet Commonly known as: Zestoretic  Take 1 tablet by mouth daily.   rosuvastatin 40 MG tablet Commonly known as: CRESTOR Take 1 tablet (40 mg total) by mouth daily. Start taking on: Oct 06, 2023        Follow-up Information     Dettinger, Lucio Sabin, MD Follow up.   Specialties: Family Medicine, Cardiology Contact information: [address] [place]  [place] [address] [phone]         Lisabeth Rider, MD Follow up.   Specialties: Neurology, Radiology Contact information: [address] [place] [address] [phone]                Discharge Exam: Jeff Walker   10/03/23 1836  Weight: 84.4 kg  BP 120/81   Pulse 81   Temp  98.3 F (36.8 C) (Oral)   Resp 16   Ht 6' (1.829 m)   Wt 84.4 kg   SpO2 96%   BMI 25.23 kg/m   Gen: No distress Pulm: Clear, nonlabored  CV: RRR, no MRG GI: Soft, NT, ND, +BS  Neuro: Alert and oriented, visual fields grossly full, distal LE impaired sensation stable. No new focal deficits. Ext: Warm, no deformities Skin: No rashes, lesions or ulcers on visualized skin   Condition at discharge: stable  The results of significant diagnostics from this hospitalization (including imaging, microbiology, ancillary and laboratory) are listed below for reference.   Imaging Studies: ECHOCARDIOGRAM COMPLETE Result Date: 10/04/2023    ECHOCARDIOGRAM REPORT   Patient Name:   Jeff Walker Date of Exam: 10/04/2023 Medical Rec #:  [id number]       Height:       72.0 in Accession #:    [id number]      Weight:       186.0 lb Date of Birth:  08/28/1965       BSA:          2.066 m Patient Age:    58 years        BP:           101/67 mmHg Patient Gender: M               HR:           75 bpm. Exam Location:  [facility] Procedure: 2D Echo, Cardiac Doppler, Color Doppler and Intracardiac            Opacification Agent (Both Spectral and Color Flow Doppler were            utilized during procedure). Indications:    Stroke l63.9  History:        Patient has no prior history of Echocardiogram examinations.                 Risk Factors:Hypertension and Current Smoker.  Sonographer:    Denese Finn RCS Referring Phys: [id number] DAWOOD S Osborne Blazer  Sonographer Comments: No subcostal window and Technically challenging study due to limited acoustic windows. Image acquisition challenging due to respiratory motion. IMPRESSIONS  1. Left ventricular ejection fraction, by estimation, is 60 to 65%. The left ventricle has normal function. The left ventricle has no regional wall motion abnormalities. There is mild left ventricular hypertrophy. Left ventricular diastolic parameters are consistent with Grade I diastolic dysfunction  (impaired relaxation).  2. Right ventricular systolic function is normal. The right ventricular size is normal.  3. The mitral valve is normal in structure. No evidence of mitral valve regurgitation. No evidence of mitral stenosis.  4. The aortic valve was not well visualized. Aortic valve regurgitation is not visualized. No aortic stenosis is present. FINDINGS  Left Ventricle: Left ventricular ejection fraction, by estimation, is 60 to 65%. The left ventricle has normal function. The left ventricle has no regional wall motion abnormalities. Definity contrast agent was given IV to delineate the left ventricular  endocardial borders. The left ventricular internal cavity  size was normal in size. There is mild left ventricular hypertrophy. Left ventricular diastolic parameters are consistent with Grade I diastolic dysfunction (impaired relaxation). Right Ventricle: The right ventricular size is normal. Right vetricular wall thickness was not well visualized. Right ventricular systolic function is normal. Left Atrium: Left atrial size was normal in size. Right Atrium: Right atrial size was normal in size. Pericardium: There is no evidence of pericardial effusion. Mitral Valve: The mitral valve is normal in structure. No evidence of mitral valve regurgitation. No evidence of mitral valve stenosis. Tricuspid Valve: The tricuspid valve is not well visualized. Tricuspid valve regurgitation is not demonstrated. No evidence of tricuspid stenosis. Aortic Valve: The aortic valve was not well visualized. Aortic valve regurgitation is not visualized. No aortic stenosis is present. Aortic valve mean gradient measures 5.0 mmHg. Aortic valve peak gradient measures 7.7 mmHg. Aortic valve area, by VTI measures 2.37 cm. Pulmonic Valve: The pulmonic valve was not well visualized. Pulmonic valve regurgitation is not visualized. No evidence of pulmonic stenosis. Aorta: The aortic root is normal in size and structure. Venous: The inferior  vena cava was not well visualized. IAS/Shunts: The interatrial septum was not well visualized.  LEFT VENTRICLE PLAX 2D LVIDd:         4.20 cm   Diastology LVIDs:         2.90 cm   LV e' medial:    7.07 cm/s LV PW:         0.90 cm   LV E/e' medial:  9.9 LV IVS:        1.20 cm   LV e' lateral:   11.70 cm/s LVOT diam:     2.10 cm   LV E/e' lateral: 6.0 LV SV:         71 LV SV Index:   35 LVOT Area:     3.46 cm  RIGHT VENTRICLE RV S prime:     11.70 cm/s TAPSE (M-mode): 2.3 cm LEFT ATRIUM             Index        RIGHT ATRIUM           Index LA diam:        2.30 cm 1.11 cm/m   RA Area:     11.10 cm LA Vol (A2C):   39.9 ml 19.31 ml/m  RA Volume:   28.10 ml  13.60 ml/m LA Vol (A4C):   19.7 ml 9.54 ml/m LA Biplane Vol: 27.7 ml 13.41 ml/m  AORTIC VALVE AV Area (Vmax):    2.34 cm AV Area (Vmean):   2.25 cm AV Area (VTI):     2.37 cm AV Vmax:           139.00 cm/s AV Vmean:          101.000 cm/s AV VTI:            0.301 m AV Peak Grad:      7.7 mmHg AV Mean Grad:      5.0 mmHg LVOT Vmax:         94.00 cm/s LVOT Vmean:        65.500 cm/s LVOT VTI:          0.206 m LVOT/AV VTI ratio: 0.68  AORTA Ao Root diam: 3.30 cm MITRAL VALVE MV Area (PHT): 1.94 cm    SHUNTS MV Decel Time: 391 msec    Systemic VTI:  0.21 m MV E velocity: 70.30 cm/s  Systemic Diam: 2.10 cm  MV A velocity: 96.00 cm/s MV E/A ratio:  0.73 Armida Lander MD Electronically signed by Armida Lander MD Signature Date/Time: 10/04/2023/4:12:06 PM    Final    MR Angiogram Neck W or Wo Contrast Result Date: 10/03/2023 CLINICAL DATA:  Initial evaluation for acute stroke. EXAM: MRA NECK WITHOUT AND WITH CONTRAST MRA HEAD WITHOUT CONTRAST TECHNIQUE: Multiplanar and multiecho pulse sequences of the neck were obtained without and with intravenous contrast. Angiographic images of the neck were obtained using MRA technique without and with intravenous contrast; Angiographic images of the Circle of Willis were obtained using MRA technique without intravenous  contrast. CONTRAST:  8.5mL GADAVIST GADOBUTROL 1 MMOL/ML IV SOLN COMPARISON:  Comparison made with brain MRI performed earlier the same day. FINDINGS: MRA NECK FINDINGS Aortic arch: Visualized aortic arch within normal limits for caliber with standard 3 vessel morphology. No stenosis or other abnormality about the origin the great vessels. Right carotid system: Right common and internal carotid arteries are patent with antegrade flow. No evidence for dissection. No hemodynamically significant stenosis about the right carotid artery system. Left carotid system: Left common and internal carotid arteries are patent with antegrade flow. No evidence for dissection. No hemodynamically significant stenosis about the left carotid artery system. Vertebral arteries: Both vertebral arteries arise from subclavian arteries. No proximal subclavian artery stenosis. Left vertebral artery dominant and widely patent without stenosis or dissection. Focal severe stenosis or possibly short segment dissection involving the proximal right vertebral artery at its origin (series 13, image 56). Diminutive right vertebral arteries patent distally without stenosis or dissection. Other: None. MRA HEAD FINDINGS Anterior circulation: Examination degraded by motion artifact. Atheromatous irregularity about the carotid siphons bilaterally without hemodynamically significant stenosis. Changes are slightly worse on the left. Right A1 dominant widely patent. Left A1 hypoplastic and/or absent, accounting for the diminutive left ICA is compared to the right. Normal anterior communicating artery complex. Both ACAs patent to their distal aspects without significant stenosis. No M1 stenosis or occlusion. Distal MCA branches perfused and fairly symmetric. Posterior circulation: Dominant left V4 segment patent without stenosis. Left PICA patent. Right vertebral artery patent as it courses into the cranial vault. Severe stenosis versus occlusion of the distal  right V4 segment just prior to the vertebrobasilar junction (series 5, image 31). Right PICA not seen. Basilar patent without stenosis. Superior cerebral arteries patent bilaterally. Both PCAs primarily supplied via the basilar. Right PCA irregular with short-segment moderate right P1 stenosis (series 1021, image 9). There is acute occlusion of the left PCA at the proximal left P2 segment, in keeping with the left PCA territory infarct (series 1021, image 7). Anatomic variants: As above. Other: No intracranial aneurysm. IMPRESSION: MRA HEAD: 1. Acute proximal left P2 occlusion, in keeping with the left PCA territory infarct. 2. Severe stenosis versus occlusion of the distal right V4 segment just prior to the vertebrobasilar junction. 3. Short-segment moderate right P1 stenosis. 4. Otherwise wide patency of the major intracranial arterial circulation. No other hemodynamically significant or correctable stenosis. MRA NECK: 1. Focal severe stenosis versus short segment dissection involving the proximal right vertebral artery at its origin. Right vertebral artery diminutive but patent distally. Dominant left vertebral artery widely patent within the neck. 2. Wide patency of both carotid artery systems within the neck. Electronically Signed   By: Virgia Griffins M.D.   On: 10/03/2023 20:08   MR ANGIO HEAD WO CONTRAST Result Date: 10/03/2023 CLINICAL DATA:  Initial evaluation for acute stroke. EXAM: MRA NECK WITHOUT AND WITH CONTRAST  MRA HEAD WITHOUT CONTRAST TECHNIQUE: Multiplanar and multiecho pulse sequences of the neck were obtained without and with intravenous contrast. Angiographic images of the neck were obtained using MRA technique without and with intravenous contrast; Angiographic images of the Circle of Willis were obtained using MRA technique without intravenous contrast. CONTRAST:  8.5mL GADAVIST GADOBUTROL 1 MMOL/ML IV SOLN COMPARISON:  Comparison made with brain MRI performed earlier the same day.  FINDINGS: MRA NECK FINDINGS Aortic arch: Visualized aortic arch within normal limits for caliber with standard 3 vessel morphology. No stenosis or other abnormality about the origin the great vessels. Right carotid system: Right common and internal carotid arteries are patent with antegrade flow. No evidence for dissection. No hemodynamically significant stenosis about the right carotid artery system. Left carotid system: Left common and internal carotid arteries are patent with antegrade flow. No evidence for dissection. No hemodynamically significant stenosis about the left carotid artery system. Vertebral arteries: Both vertebral arteries arise from subclavian arteries. No proximal subclavian artery stenosis. Left vertebral artery dominant and widely patent without stenosis or dissection. Focal severe stenosis or possibly short segment dissection involving the proximal right vertebral artery at its origin (series 13, image 56). Diminutive right vertebral arteries patent distally without stenosis or dissection. Other: None. MRA HEAD FINDINGS Anterior circulation: Examination degraded by motion artifact. Atheromatous irregularity about the carotid siphons bilaterally without hemodynamically significant stenosis. Changes are slightly worse on the left. Right A1 dominant widely patent. Left A1 hypoplastic and/or absent, accounting for the diminutive left ICA is compared to the right. Normal anterior communicating artery complex. Both ACAs patent to their distal aspects without significant stenosis. No M1 stenosis or occlusion. Distal MCA branches perfused and fairly symmetric. Posterior circulation: Dominant left V4 segment patent without stenosis. Left PICA patent. Right vertebral artery patent as it courses into the cranial vault. Severe stenosis versus occlusion of the distal right V4 segment just prior to the vertebrobasilar junction (series 5, image 31). Right PICA not seen. Basilar patent without stenosis.  Superior cerebral arteries patent bilaterally. Both PCAs primarily supplied via the basilar. Right PCA irregular with short-segment moderate right P1 stenosis (series 1021, image 9). There is acute occlusion of the left PCA at the proximal left P2 segment, in keeping with the left PCA territory infarct (series 1021, image 7). Anatomic variants: As above. Other: No intracranial aneurysm. IMPRESSION: MRA HEAD: 1. Acute proximal left P2 occlusion, in keeping with the left PCA territory infarct. 2. Severe stenosis versus occlusion of the distal right V4 segment just prior to the vertebrobasilar junction. 3. Short-segment moderate right P1 stenosis. 4. Otherwise wide patency of the major intracranial arterial circulation. No other hemodynamically significant or correctable stenosis. MRA NECK: 1. Focal severe stenosis versus short segment dissection involving the proximal right vertebral artery at its origin. Right vertebral artery diminutive but patent distally. Dominant left vertebral artery widely patent within the neck. 2. Wide patency of both carotid artery systems within the neck. Electronically Signed   By: Virgia Griffins M.D.   On: 10/03/2023 20:08   MR Brain Wo Contrast Addendum Date: 10/03/2023 ADDENDUM REPORT: 10/03/2023 17:21 ADDENDUM: Impression #1 called by telephone at the time of interpretation on 10/03/2023 at 5:19 pm to provider Jolyne Needs , who verbally acknowledged these results. Electronically Signed   By: Bascom Lily D.O.   On: 10/03/2023 17:21   Result Date: 10/03/2023 CLINICAL DATA:  Provided history: Neuro deficit, acute, stroke suspected. EXAM: MRI HEAD WITHOUT CONTRAST TECHNIQUE: Multiplanar, multiecho pulse sequences of the  brain and surrounding structures were obtained without intravenous contrast. COMPARISON:  Head CT 01/29/2011. FINDINGS: Brain: Mild generalized cerebral atrophy. Multiple acute cortical infarcts within the left occipital lobe measuring up to 2.7 cm (PCA  vascular territory). Multiple small acute cortical infarcts within the left occipital lobe (PCA vascular territory). Chronic lacunar infarct within the right lentiform nucleus (series 10, image 16). Multifocal T2 FLAIR hyperintense signal abnormality within the cerebral white matter, nonspecific but compatible with moderate chronic small vessel ischemic disease. Findings are greater than expected for age. No evidence of an intracranial mass. No chronic intracranial blood products. No extra-axial fluid collection. No midline shift. Vascular: Maintained flow voids within the proximal large arterial vessels. Skull and upper cervical spine: No focal worrisome marrow lesion. Incompletely assessed cervical spondylosis. Sinuses/Orbits: No mass or acute finding within the imaged orbits. No significant paranasal sinus disease. Other: Trace fluid within right mastoid air cells. Attempts are being made to reach the ordering provider at this time. IMPRESSION: 1. Multiple acute cortical infarcts within the left occipital lobe measuring up to 2.7 cm (PCA vascular territory). 2. Chronic lacunar infarct within the right lentiform nucleus 3. Background chronic small vessel ischemic changes within the cerebral white matter, moderate in severity and greater than expected for age. 4. Mild generalized cerebral atrophy. 5. Trace right mastoid effusion. Electronically Signed: By: Bascom Lily D.O. On: 10/03/2023 17:14    Microbiology: Results for orders placed or performed in visit on 04/13/22  Novel Coronavirus, NAA ([facility])     Status: None   Collection Time: 04/13/22  2:49 PM   Specimen: Nasopharyngeal(NP) swabs in vial transport medium  Result Value Ref Range Status   SARS-CoV-2, NAA Not Detected Not Detected Final    Comment: This nucleic acid amplification test was developed and its performance characteristics determined by [employer]. Nucleic acid amplification tests include RT-PCR and TMA. This test has not  been FDA cleared or approved. This test has been authorized by FDA under an Emergency Use Authorization (EUA). This test is only authorized for the duration of time the declaration that circumstances exist justifying the authorization of the emergency use of in vitro diagnostic tests for detection of SARS-CoV-2 virus and/or diagnosis of COVID-19 infection under section 564(b)(1) of the Act, 21 U.S.C. 409WJX-9(J) (1), unless the authorization is terminated or revoked sooner. When diagnostic testing is negative, the possibility of a false negative result should be considered in the context of a patient's recent exposures and the presence of clinical signs and symptoms consistent with COVID-19. An individual without symptoms of COVID-19 and who is not shedding SARS-CoV-2 virus wo uld expect to have a negative (not detected) result in this assay.     Labs: CBC: Recent Labs  Lab 10/03/23 1915 10/04/23 0519  WBC 8.0 7.3  NEUTROABS 4.7  --   HGB 15.0 13.9  HCT 43.4 41.1  MCV 87.7 87.8  PLT 316 281   Basic Metabolic Panel: Recent Labs  Lab 10/03/23 1915 10/03/23 2128  NA 133* 133*  K 3.9 3.7  CL 98 101  CO2 24 21*  GLUCOSE 96 98  BUN 17 17  CREATININE 1.59* 1.45*  CALCIUM  9.1 8.9   Liver Function Tests: Recent Labs  Lab 10/03/23 1915 10/03/23 2128  AST 15 12*  ALT 12 13  ALKPHOS 76 70  BILITOT 0.6 0.7  PROT 7.4 7.1  ALBUMIN 3.7 3.5   CBG: No results for input(s): "GLUCAP" in the last 168 hours.  Discharge time spent: greater than  30 minutes.  Signed: Wynetta Heckle, MD [facility] 10/05/2023

## 2023-10-05 NOTE — Plan of Care (Signed)
  Problem: Education: Goal: Knowledge of disease or condition will improve Outcome: Progressing Goal: Knowledge of secondary prevention will improve (MUST DOCUMENT ALL) Outcome: Progressing Goal: Knowledge of patient specific risk factors will improve (DELETE if not current risk factor) Outcome: Progressing   Problem: Ischemic Stroke/TIA Tissue Perfusion: Goal: Complications of ischemic stroke/TIA will be minimized Outcome: Progressing   Problem: Coping: Goal: Will verbalize positive feelings about self Outcome: Progressing

## 2023-10-05 NOTE — Plan of Care (Signed)
  Problem: Education: Goal: Knowledge of disease or condition will improve 10/05/2023 0538 by Celester Colace, RN Outcome: Progressing 10/05/2023 0404 by Celester Colace, RN Outcome: Progressing Goal: Knowledge of secondary prevention will improve (MUST DOCUMENT ALL) 10/05/2023 0538 by Celester Colace, RN Outcome: Progressing 10/05/2023 0404 by Celester Colace, RN Outcome: Progressing Goal: Knowledge of patient specific risk factors will improve (DELETE if not current risk factor) 10/05/2023 0538 by Celester Colace, RN Outcome: Progressing 10/05/2023 0404 by Celester Colace, RN Outcome: Progressing   Problem: Ischemic Stroke/TIA Tissue Perfusion: Goal: Complications of ischemic stroke/TIA will be minimized Outcome: Progressing   Problem: Coping: Goal: Will verbalize positive feelings about self Outcome: Progressing

## 2023-10-07 ENCOUNTER — Other Ambulatory Visit: Payer: Self-pay | Admitting: Medical Genetics

## 2023-10-09 NOTE — Telephone Encounter (Signed)
 Order placed and and pt enrolled in preventice.

## 2023-10-09 NOTE — Telephone Encounter (Signed)
-----   Message from Cadence Rebekah Canada sent at 10/08/2023  8:49 AM EDT ----- Regarding: hosp follow-up Pt with recent stroke. Needs TEE and 30 day  heart monitor per neurology.  We can bring the patient in to discuss and consent, or I guess I can do it over the phone. Can further discuss when I come back in the office. Thanks!

## 2023-10-10 ENCOUNTER — Encounter: Payer: Self-pay | Admitting: Neurology

## 2023-10-10 ENCOUNTER — Ambulatory Visit: Admitting: Neurology

## 2023-10-10 VITALS — BP 108/71 | HR 100 | Ht 72.0 in | Wt 187.0 lb

## 2023-10-10 DIAGNOSIS — I639 Cerebral infarction, unspecified: Secondary | ICD-10-CM

## 2023-10-10 DIAGNOSIS — H53461 Homonymous bilateral field defects, right side: Secondary | ICD-10-CM

## 2023-10-10 DIAGNOSIS — I63532 Cerebral infarction due to unspecified occlusion or stenosis of left posterior cerebral artery: Secondary | ICD-10-CM

## 2023-10-10 NOTE — Progress Notes (Signed)
 [facility] [address] [place]. [place] [address] [phone]       OFFICE CONSULT NOTE  Jeff Walker Date of Birth:  [birth year] Medical Record Number:  [id number]   Referring MD: Roxy Cordial  Reason for Referral: Stroke  HPI: Mr. Jeff Walker is a 58 year old pleasant male seen today for initial office consultation visit for stroke.  History is obtained to the patient and review of electronic medical records.  I personally reviewed pertinent available imaging films in PACS.  He has past medical history of hypertension and cigarette smoking.  He presented on 10/04/2023 to [facility] with initially episode of syncope and brief loss of consciousness in a grocery store 1 week prior to his ER visit.  Patient was unable to pinpoint exactly what happened.  EMS arrived and started recovering fairly quickly and he refused to go to the hospital for evaluation.  Since then he started noticing intermittent vision difficulties in the right eye.  He describes this as a feeling of difficulty focusing which may last for hours.  There is no accompanying headache.  He denied accompanying symptoms in the form of slurred speech, double vision, vertigo or extremity weakness numbness or gait or balance problems.  These episodes used to occur about twice a week or so.  There are no apparent triggers.  His last episode was last Friday.  MRI scan of the brain showed a left occipital infarct and MR angiogram of the brain showed left posterior cerebral artery occlusion in the P2 segment.  There was also severe right vertebral artery stenosis in the V4 segment and moderate right posterior cerebral artery stenosis in the P1 segment.  MRA of the neck showed high-grade proximal right vertebral artery stenosis.  Echocardiogram showed ejection fraction of 60 to 65%.  Left atrial size was normal.  LDL cholesterol 142 mg percent.  Hemoglobin A1c was 5.6.  He denies any prior history of strokes TIA  seizures or significant neurological problems.  Patient has been smoking for many years and says he will try to quit.  He is tolerating aspirin  and Plavix  well with minor bruising no bleeding.  He states blood pressures under good control.  He is tolerating Crestor  well without muscle aches and pains.  He does complain of mild short-term memory difficulties but he feels these are manageable and not in his activities of daily living.  ROS:   14 system review of systems is positive for syncope, passing out, loss of consciousness, vision difficulty, difficulty focusing, memory difficulties and all other systems negative  PMH:  Past Medical History:  Diagnosis Date   Depression    Environmental allergies    Hypertension    Shingles    Stroke [facility])     Social History:  Social History   Socioeconomic History   Marital status: Single    Spouse name: Not on file   Number of children: Not on file   Years of education: Not on file   Highest education level: Associate degree: occupational, Scientist, product/process development, or vocational program  Occupational History   Not on file  Tobacco Use   Smoking status: Every Day    Current packs/day: 1.00    Average packs/day: 1 pack/day for 35.0 years (35.0 ttl pk-yrs)    Types: Cigarettes   Smokeless tobacco: Never  Vaping Use   Vaping status: Former  Substance and Sexual Activity   Alcohol use: Not Currently    Alcohol/week: 6.0 standard drinks of alcohol  Types: 6 Cans of beer per week    Comment: quit over 5 yrs ago   Drug use: No   Sexual activity: Yes    Birth control/protection: Condom  Other Topics Concern   Not on file  Social History Narrative   Not on file   Social Drivers of Health   Financial Resource Strain: Low Risk  (08/04/2023)   Overall Financial Resource Strain (CARDIA)    Difficulty of Paying Living Expenses: Not very hard  Food Insecurity: Food Insecurity Present (10/04/2023)   Hunger Vital Sign    Worried About Running Out of Food  in the Last Year: Sometimes true    Ran Out of Food in the Last Year: Sometimes true  Transportation Needs: Unmet Transportation Needs (10/04/2023)   PRAPARE - Transportation    Lack of Transportation (Medical): Yes    Lack of Transportation (Non-Medical): Yes  Physical Activity: Unknown (08/04/2023)   Exercise Vital Sign    Days of Exercise per Week: 0 days    Minutes of Exercise per Session: Not on file  Stress: No Stress Concern Present (08/04/2023)   [employer] of Occupational Health - Occupational Stress Questionnaire    Feeling of Stress : Only a little  Social Connections: Socially Isolated (08/04/2023)   Social Connection and Isolation Panel [NHANES]    Frequency of Communication with Friends and Family: Never    Frequency of Social Gatherings with Friends and Family: Never    Attends Religious Services: Never    Database administrator or Organizations: No    Attends Engineer, structural: Not on file    Marital Status: Never married  Intimate Partner Violence: Not At Risk (10/04/2023)   Humiliation, Afraid, Rape, and Kick questionnaire    Fear of Current or Ex-Partner: No    Emotionally Abused: No    Physically Abused: No    Sexually Abused: No    Medications:   Current Outpatient Medications on File Prior to Visit  Medication Sig Dispense Refill   amLODipine  (NORVASC ) 10 MG tablet Take 1 tablet (10 mg total) by mouth daily. 90 tablet 3   aspirin  EC 81 MG tablet Take 1 tablet (81 mg total) by mouth daily. Swallow whole. 30 tablet 12   clopidogrel (PLAVIX) 75 MG tablet Take 1 tablet (75 mg total) by mouth daily. 90 tablet 0   lisinopril -hydrochlorothiazide  (ZESTORETIC ) 20-12.5 MG tablet Take 1 tablet by mouth daily. 90 tablet 3   rosuvastatin (CRESTOR) 40 MG tablet Take 1 tablet (40 mg total) by mouth daily. 30 tablet 1   No current facility-administered medications on file prior to visit.    Allergies:  No Known Allergies  Physical Exam General: well  developed, well nourished, seated, in no evident distress Head: head normocephalic and atraumatic.   Neck: supple with no carotid or supraclavicular bruits Cardiovascular: regular rate and rhythm, no murmurs Musculoskeletal: no deformity Skin:  no rash/petichiae Vascular:  Normal pulses all extremities  Neurologic Exam Mental Status: Awake and fully alert. Oriented to place and time. Recent and remote memory intact. Attention span, concentration and fund of knowledge appropriate. Mood and affect appropriate.  Diminished recall 2/3.  Able to name 13 animals which can walk on forelegs. Cranial Nerves: Fundoscopic exam reveals sharp disc margins. Pupils equal, briskly reactive to light. Extraocular movements full without nystagmus. Visual fields show partial right homonymous hemianopsia to confrontation. Hearing intact. Facial sensation intact. Face, tongue, palate moves normally and symmetrically.  Motor: Normal bulk and tone. Normal  strength in all tested extremity muscles. Sensory.: intact to touch , pinprick , position and vibratory sensation.  Coordination: Rapid alternating movements normal in all extremities. Finger-to-nose and heel-to-shin performed accurately bilaterally. Gait and Station: Arises from chair without difficulty. Stance is normal. Gait demonstrates favoring of the right hip due to pain.  Unable to walk tandem without difficulty.   Reflexes: 1+ and symmetric. Toes downgoing.   NIHSS  1 Modified Rankin  2   ASSESSMENT: 58 year old patient with right eye vision disturbance due to embolic left PCA infarct from left PCA occlusion with vascular risk factors of smoking, intracranial and extracranial atherosclerosis, hyperlipidemia and hypertension     PLAN:I had a long d/w patient about his recent occipital stroke, right-sided peripheral vision loss, risk for recurrent stroke/TIAs, personally independently reviewed imaging studies and stroke evaluation results and answered  questions.Continue aspirin  81 mg and Plavix 75 mg daily for 3 months and then stop Plavix and stay on aspirin  alone for secondary stroke prevention and maintain strict control of hypertension with blood pressure goal below 130/90, diabetes with hemoglobin A1c goal below 6.5% and lipids with LDL cholesterol goal below 70 mg/dL. I also advised the patient to eat a healthy diet with plenty of whole grains, cereals, fruits and vegetables, exercise regularly and maintain ideal body weight.  I counseled the patient to quit smoking completely and he says he will try.  Advised him to see health from primary care physician for the same.  Keep appointment for 30-day heart monitor to look for paroxysmal A-fib.  If patient has recurrent posterior circulation symptoms despite maximal medical therapy may consider elective vertebral angioplasty and stenting in the future.  I have counseled the patient not to drive till his peripheral vision loss improves.  Followup in the future with me in 6 months or call earlier if necessary. Greater than 50% time during this 45-minute consultation visit was spent in counseling and coordination of care about his occipital stroke and vertebral artery stenosis and discussion about stroke evaluation and prevention and treatment and answering questions. Ardella Beaver, MD Note: This document was prepared with digital dictation and possible smart phrase technology. Any transcriptional errors that result from this process are unintentional.

## 2023-10-10 NOTE — Patient Instructions (Signed)
 I had a long d/w patient about his recent occipital stroke, right-sided peripheral vision loss, risk for recurrent stroke/TIAs, personally independently reviewed imaging studies and stroke evaluation results and answered questions.Continue aspirin  81 mg and Plavix  75 mg daily for 3 months and then stop Plavix  and stay on aspirin  alone for secondary stroke prevention and maintain strict control of hypertension with blood pressure goal below 130/90, diabetes with hemoglobin A1c goal below 6.5% and lipids with LDL cholesterol goal below 70 mg/dL. I also advised the patient to eat a healthy diet with plenty of whole grains, cereals, fruits and vegetables, exercise regularly and maintain ideal body weight.  I counseled the patient to quit smoking completely and he says he will try.  Advised him to see health from primary care physician for the same.  Keep appointment for 30-day heart monitor to look for paroxysmal A-fib.  I have counseled the patient not to drive till his peripheral vision loss improves.  Followup in the future with me in 6 months or call earlier if necessary.  Stroke Prevention Some medical conditions and behaviors can lead to a higher chance of having a stroke. You can help prevent a stroke by eating healthy, exercising, not smoking, and managing any medical conditions you have. Stroke is a leading cause of functional impairment. Primary prevention is particularly important because a majority of strokes are first-time events. Stroke changes the lives of not only those who experience a stroke but also their family and other caregivers. How can this condition affect me? A stroke is a medical emergency and should be treated right away. A stroke can lead to brain damage and can sometimes be life-threatening. If a person gets medical treatment right away, there is a better chance of surviving and recovering from a stroke. What can increase my risk? The following medical conditions may increase your  risk of a stroke: Cardiovascular disease. High blood pressure (hypertension). Diabetes. High cholesterol. Sickle cell disease. Blood clotting disorders (hypercoagulable state). Obesity. Sleep disorders (obstructive sleep apnea). Other risk factors include: Being older than age 25. Having a history of blood clots, stroke, or mini-stroke (transient ischemic attack, TIA). Genetic factors, such as race, ethnicity, or a family history of stroke. Smoking cigarettes or using other tobacco products. Taking birth control pills, especially if you also use tobacco. Heavy use of alcohol or drugs, especially cocaine and methamphetamine. Physical inactivity. What actions can I take to prevent this? Manage your health conditions High cholesterol levels. Eating a healthy diet is important for preventing high cholesterol. If cholesterol cannot be managed through diet alone, you may need to take medicines. Take any prescribed medicines to control your cholesterol as told by your health care provider. Hypertension. To reduce your risk of stroke, try to keep your blood pressure below 130/80. Eating a healthy diet and exercising regularly are important for controlling blood pressure. If these steps are not enough to manage your blood pressure, you may need to take medicines. Take any prescribed medicines to control hypertension as told by your health care provider. Ask your health care provider if you should monitor your blood pressure at home. Have your blood pressure checked every year, even if your blood pressure is normal. Blood pressure increases with age and some medical conditions. Diabetes. Eating a healthy diet and exercising regularly are important parts of managing your blood sugar (glucose). If your blood sugar cannot be managed through diet and exercise, you may need to take medicines. Take any prescribed medicines to control your  diabetes as told by your health care provider. Get evaluated  for obstructive sleep apnea. Talk to your health care provider about getting a sleep evaluation if you snore a lot or have excessive sleepiness. Make sure that any other medical conditions you have, such as atrial fibrillation or atherosclerosis, are managed. Nutrition Follow instructions from your health care provider about what to eat or drink to help manage your health condition. These instructions may include: Reducing your daily calorie intake. Limiting how much salt (sodium) you use to 1,500 milligrams (mg) each day. Using only healthy fats for cooking, such as olive oil, canola oil, or sunflower oil. Eating healthy foods. You can do this by: Choosing foods that are high in fiber, such as whole grains, and fresh fruits and vegetables. Eating at least 5 servings of fruits and vegetables a day. Try to fill one-half of your plate with fruits and vegetables at each meal. Choosing lean protein foods, such as lean cuts of meat, poultry without skin, fish, tofu, beans, and nuts. Eating low-fat dairy products. Avoiding foods that are high in sodium. This can help lower blood pressure. Avoiding foods that have saturated fat, trans fat, and cholesterol. This can help prevent high cholesterol. Avoiding processed and prepared foods. Counting your daily carbohydrate intake.  Lifestyle If you drink alcohol: Limit how much you have to: 0-1 drink a day for women who are not pregnant. 0-2 drinks a day for men. Know how much alcohol is in your drink. In the [place] one drink equals one 12 oz bottle of beer ( ), one 5 oz glass of wine ( ), or one 1 oz glass of hard liquor (44mL). Do not use any products that contain nicotine  or tobacco. These products include cigarettes, chewing tobacco, and vaping devices, such as e-cigarettes. If you need help quitting, ask your health care provider. Avoid secondhand smoke. Do not use drugs. Activity  Try to stay at a healthy weight. Get at least 30 minutes  of exercise on most days, such as: Fast walking. Biking. Swimming. Medicines Take over-the-counter and prescription medicines only as told by your health care provider. Aspirin  or blood thinners (antiplatelets or anticoagulants) may be recommended to reduce your risk of forming blood clots that can lead to stroke. Avoid taking birth control pills. Talk to your health care provider about the risks of taking birth control pills if: You are over 8 years old. You smoke. You get very bad headaches. You have had a blood clot. Where to find more information American Stroke Association: www.strokeassociation.org Get help right away if: You or a loved one has any symptoms of a stroke. "BE FAST" is an easy way to remember the main warning signs of a stroke: B - Balance. Signs are dizziness, sudden trouble walking, or loss of balance. E - Eyes. Signs are trouble seeing or a sudden change in vision. F - Face. Signs are sudden weakness or numbness of the face, or the face or eyelid drooping on one side. A - Arms. Signs are weakness or numbness in an arm. This happens suddenly and usually on one side of the body. S - Speech. Signs are sudden trouble speaking, slurred speech, or trouble understanding what people say. T - Time. Time to call emergency services. Write down what time symptoms started. You or a loved one has other signs of a stroke, such as: A sudden, severe headache with no known cause. Nausea or vomiting. Seizure. These symptoms may represent a serious problem that is an emergency.  Do not wait to see if the symptoms will go away. Get medical help right away. Call your local emergency services (911 in the [place] Do not drive yourself to the hospital. Summary You can help to prevent a stroke by eating healthy, exercising, not smoking, limiting alcohol intake, and managing any medical conditions you may have. Do not use any products that contain nicotine or tobacco. These include cigarettes,  chewing tobacco, and vaping devices, such as e-cigarettes. If you need help quitting, ask your health care provider. Remember "BE FAST" for warning signs of a stroke. Get help right away if you or a loved one has any of these signs. This information is not intended to replace advice given to you by your health care provider. Make sure you discuss any questions you have with your health care provider. Document Revised: 04/02/2022 Document Reviewed: 04/02/2022 Elsevier Patient Education  2024 [employer].

## 2023-10-17 ENCOUNTER — Ambulatory Visit: Attending: Cardiology

## 2023-10-17 DIAGNOSIS — I639 Cerebral infarction, unspecified: Secondary | ICD-10-CM

## 2023-11-19 ENCOUNTER — Ambulatory Visit: Payer: Self-pay | Admitting: Cardiology

## 2023-11-19 DIAGNOSIS — G459 Transient cerebral ischemic attack, unspecified: Secondary | ICD-10-CM | POA: Diagnosis not present

## 2023-12-24 ENCOUNTER — Ambulatory Visit: Attending: Internal Medicine | Admitting: Internal Medicine

## 2023-12-24 NOTE — Progress Notes (Signed)
 Erroneous encounter - please disregard.

## 2023-12-31 ENCOUNTER — Ambulatory Visit: Payer: Self-pay

## 2023-12-31 NOTE — Telephone Encounter (Signed)
 FYI Only or Action Required?: FYI only for provider.  Patient was last seen in primary care on 10/03/2023 by Dettinger, Fonda LABOR, MD.  Called Nurse Triage reporting Dizziness.  Symptoms began about a month ago.  Interventions attempted: Other: Monitors BP and HR.  Symptoms are: gradually worsening.  Triage Disposition: See Physician Within 24 Hours  Patient/caregiver understands and will follow disposition?: Yes            Copied from CRM [id number]. Topic: Clinical - Red Word Triage >> Dec 31, 2023 10:20 AM Kevelyn M wrote: Red Word that prompted transfer to Nurse Triage: Dizziness and has been going for 3 weeks now. He fell twice. 2  weeks ago. Reason for Disposition  [1] MODERATE dizziness (e.g., interferes with normal activities) AND [2] has NOT been evaluated by doctor (or NP/PA) for this  (Exception: Dizziness caused by heat exposure, sudden standing, or poor fluid intake.)  Answer Assessment - Initial Assessment Questions Patient states upon standing he feels lightheaded and dizzy. Patient fell twice a little over 2 weeks ago. He states his blood pressure is fine at home. Took BP last week and states it was normal.  Hx of mini strokes. Has foot numbness and L hand numbness (states he is holding the phone with this hand and is unsure if this is what is causing the numbness). He states the severity of dizziness varies and states symptoms are mild currently.         1. DESCRIPTION: Describe your dizziness.     When standing everything feels light headed  2. LIGHTHEADED: Do you feel lightheaded? (e.g., somewhat faint, woozy, weak upon standing)     Weak upon standing  3. VERTIGO: Do you feel like either you or the room is spinning or tilting? (i.e., vertigo)     Denies  4. SEVERITY: How bad is it?  Do you feel like you are going to faint? Can you stand and walk?     75% of time gets dizzy upon standing and has to hold onto something  5. ONSET:  When did  the dizziness begin?     4 weeks ago  6. AGGRAVATING FACTORS: Does anything make it worse? (e.g., standing, change in head position)     Just upon standing  7. HEART RATE: Can you tell me your heart rate? How many beats in 15 seconds?  (Note: Not all patients can do this.)       Wore heart monitor for a month and states HR was fine  8. CAUSE: What do you think is causing the dizziness? (e.g., decreased fluids or food, diarrhea, emotional distress, heat exposure, new medicine, sudden standing, vomiting; unknown)     Unsure  9. RECURRENT SYMPTOM: Have you had dizziness before? If Yes, ask: When was the last time? What happened that time?     No started 3-4 weeks ago.  10. OTHER SYMPTOMS: Do you have any other symptoms? (e.g., fever, chest pain, vomiting, diarrhea, bleeding)       Has had numbness in feet that has been ongoing and numbness in his L hand.  Protocols used: Dizziness - Lightheadedness-A-AH

## 2023-12-31 NOTE — Telephone Encounter (Signed)
 Apt scheduled.

## 2024-01-01 ENCOUNTER — Encounter: Payer: Self-pay | Admitting: Family Medicine

## 2024-01-01 ENCOUNTER — Ambulatory Visit: Admitting: Family Medicine

## 2024-01-01 VITALS — BP 119/72 | HR 110 | Temp 98.3°F | Ht 72.0 in | Wt 174.0 lb

## 2024-01-01 DIAGNOSIS — I951 Orthostatic hypotension: Secondary | ICD-10-CM | POA: Diagnosis not present

## 2024-01-01 DIAGNOSIS — I10 Essential (primary) hypertension: Secondary | ICD-10-CM | POA: Diagnosis not present

## 2024-01-01 MED ORDER — AMLODIPINE BESYLATE 5 MG PO TABS: 5.0000 mg | ORAL_TABLET | Freq: Every day | ORAL | 1 refills | Status: DC

## 2024-01-01 NOTE — Progress Notes (Signed)
 BP 119/72   Pulse (!) 110   Temp 98.3 F (36.8 C)   Ht 6' (1.829 m)   Wt 174 lb (78.9 kg)   SpO2 96%   BMI 23.60 kg/m    Subjective:   Patient ID: Elsie CHRISTELLA Lover, male    DOB: [birth year], 58 y.o.   MRN: [id number]  HPI: HAIK MAHONEY is a 58 y.o. male presenting on 01/01/2024 for Dizziness   Discussed the use of AI scribe software for clinical note transcription with the patient, who gave verbal consent to proceed.  History of Present Illness   JES COSTALES is a 58 year old male with a history of stroke who presents with dizziness and episodes of syncope.  He has been experiencing dizziness for about a month, describing it as the room spinning and feeling lightheaded, especially when standing. The dizziness has led to two falls, with one incident three weeks ago where he hit his face on the floor and experienced visual disturbances. The dizziness varies in severity, sometimes requiring him to hold onto something for stability. He has not completely lost consciousness but has come close.  He is currently taking lisinopril  hydrochlorothiazide  20/12.5 mg and amlodipine  10 mg for blood pressure management.  Since having a stroke, he has experienced a decrease in appetite, leading to unintended weight loss. He eats several times a day but with less appetite. No changes in taste, stomach issues, or constipation, although he occasionally experiences heartburn.  He has a history of smoking but has reduced his consumption to a pack every three days. He used to exercise regularly but has paused due to the dizziness.          Relevant past medical, surgical, family and social history reviewed and updated as indicated. Interim medical history since our last visit reviewed. Allergies and medications reviewed and updated.  Review of Systems  Constitutional:  Positive for appetite change and unexpected weight change. Negative for chills and fever.  Eyes:  Negative for discharge.   Respiratory:  Negative for shortness of breath and wheezing.   Cardiovascular:  Negative for chest pain and leg swelling.  Musculoskeletal:  Negative for back pain and gait problem.  Skin:  Negative for rash.  Neurological:  Positive for dizziness and light-headedness. Negative for numbness.  All other systems reviewed and are negative.   Per HPI unless specifically indicated above   Allergies as of 01/01/2024   No Known Allergies      Medication List        Accurate as of January 01, 2024 10:26 AM. If you have any questions, ask your nurse or doctor.          amLODipine  5 MG tablet Commonly known as: NORVASC  Take 1 tablet (5 mg total) by mouth daily. What changed:  medication strength how much to take Changed by: Fonda LABOR Shariya Gaster   aspirin  EC 81 MG tablet Take 1 tablet (81 mg total) by mouth daily. Swallow whole.   clopidogrel  75 MG tablet Commonly known as: Plavix  Take 1 tablet (75 mg total) by mouth daily.   lisinopril -hydrochlorothiazide  20-12.5 MG tablet Commonly known as: Zestoretic  Take 1 tablet by mouth daily.   rosuvastatin  40 MG tablet Commonly known as: CRESTOR  Take 1 tablet (40 mg total) by mouth daily.         Objective:   BP 119/72   Pulse (!) 110   Temp 98.3 F (36.8 C)   Ht 6' (1.829 m)   [employer]  174 lb (78.9 kg)   SpO2 96%   BMI 23.60 kg/m   Wt Readings from Last 3 Encounters:  01/01/24 174 lb (78.9 kg)  10/10/23 187 lb (84.8 kg)  10/03/23 186 lb (84.4 kg)    Physical Exam Physical Exam   VITALS: P- 120, BP- 96/68 CHEST: Lungs clear to auscultation CARDIOVASCULAR: Heart sounds normal         Assessment & Plan:   Problem List Items Addressed This Visit       Cardiovascular and Mediastinum   Hypertension   Relevant Medications   amLODipine  (NORVASC ) 5 MG tablet   Other Visit Diagnoses       Orthostatic hypotension    -  Primary   Relevant Medications   amLODipine  (NORVASC ) 5 MG tablet          Orthostatic  hypotension Orthostatic hypotension with dizziness and near syncope for a month. Significant blood pressure drop upon standing with compensatory tachycardia. Recent weight loss and decreased appetite may contribute. Antihypertensive medication may exacerbate hypotension. - Reduce amlodipine  dose from 10 mg to 5 mg daily by cutting current tablets in half. - Monitor blood pressure and symptoms over the next two to three weeks. - Send a message via MyChart in two weeks with blood pressure readings and symptom updates. - Prescribe 5 mg amlodipine  tablets for future use, but continue using current supply by halving tablets. - Advise to stand up slowly and be cautious to prevent falls. - Encourage adequate hydration.  Unintentional weight loss and decreased appetite Unintentional weight loss and decreased appetite since stroke. No changes in taste or significant gastrointestinal symptoms. Weight loss may contribute to orthostatic hypotension. Current weight is appropriate, but further weight loss is undesirable. - Encourage increased caloric intake to prevent further weight loss. - Consider using nutritional supplements like Ensure or Boost once daily to increase caloric intake.  Nicotine  dependence, cigarettes Nicotine  dependence with current smoking reduced to approximately one pack every three days. Motivated to quit smoking with a history of previous successful cessation attempts. - Encourage continued efforts to reduce smoking and eventually quit.          Follow up plan: Return if symptoms worsen or fail to improve.  Counseling provided for all of the vaccine components No orders of the defined types were placed in this encounter.   Fonda Levins, MD [facility] 01/01/2024, 10:26 AM

## 2024-01-25 ENCOUNTER — Encounter: Payer: Self-pay | Admitting: Family Medicine

## 2024-01-25 ENCOUNTER — Other Ambulatory Visit: Payer: Self-pay | Admitting: Nurse Practitioner

## 2024-01-27 ENCOUNTER — Other Ambulatory Visit: Payer: Self-pay

## 2024-01-27 DIAGNOSIS — I10 Essential (primary) hypertension: Secondary | ICD-10-CM

## 2024-01-27 MED ORDER — AMLODIPINE BESYLATE 5 MG PO TABS: 5.0000 mg | ORAL_TABLET | Freq: Every day | ORAL | 1 refills | Status: AC

## 2024-03-10 ENCOUNTER — Other Ambulatory Visit: Payer: Self-pay | Admitting: Medical Genetics

## 2024-03-10 DIAGNOSIS — Z006 Encounter for examination for normal comparison and control in clinical research program: Secondary | ICD-10-CM

## 2024-03-19 ENCOUNTER — Ambulatory Visit: Attending: Internal Medicine | Admitting: Internal Medicine

## 2024-03-19 NOTE — Progress Notes (Signed)
 Erroneous encounter - please disregard.

## 2024-04-18 ENCOUNTER — Other Ambulatory Visit: Payer: Self-pay | Admitting: Family Medicine
